# Patient Record
Sex: Female | Born: 1965
Health system: Southern US, Community
[De-identification: ages and names within clinical notes are randomized; demographics above are authoritative.]

## PROBLEM LIST (undated history)

## (undated) DIAGNOSIS — K9041 Non-celiac gluten sensitivity: Secondary | ICD-10-CM

## (undated) DIAGNOSIS — I10 Essential (primary) hypertension: Secondary | ICD-10-CM

## (undated) DIAGNOSIS — R112 Nausea with vomiting, unspecified: Secondary | ICD-10-CM

## (undated) DIAGNOSIS — H903 Sensorineural hearing loss, bilateral: Secondary | ICD-10-CM

## (undated) DIAGNOSIS — M1990 Unspecified osteoarthritis, unspecified site: Secondary | ICD-10-CM

## (undated) DIAGNOSIS — K580 Irritable bowel syndrome with diarrhea: Secondary | ICD-10-CM

## (undated) DIAGNOSIS — Z862 Personal history of diseases of the blood and blood-forming organs and certain disorders involving the immune mechanism: Secondary | ICD-10-CM

## (undated) DIAGNOSIS — K900 Celiac disease: Secondary | ICD-10-CM

## (undated) DIAGNOSIS — N882 Stricture and stenosis of cervix uteri: Secondary | ICD-10-CM

## (undated) DIAGNOSIS — Z9889 Other specified postprocedural states: Secondary | ICD-10-CM

## (undated) DIAGNOSIS — Z5181 Encounter for therapeutic drug level monitoring: Secondary | ICD-10-CM

## (undated) DIAGNOSIS — Z860101 Personal history of adenomatous and serrated colon polyps: Secondary | ICD-10-CM

## (undated) DIAGNOSIS — M069 Rheumatoid arthritis, unspecified: Secondary | ICD-10-CM

## (undated) DIAGNOSIS — Z974 Presence of external hearing-aid: Secondary | ICD-10-CM

## (undated) DIAGNOSIS — N939 Abnormal uterine and vaginal bleeding, unspecified: Secondary | ICD-10-CM

## (undated) DIAGNOSIS — Z86010 Personal history of colonic polyps: Secondary | ICD-10-CM

## (undated) HISTORY — PX: TUBAL LIGATION: SHX77

## (undated) HISTORY — PX: TOTAL KNEE ARTHROPLASTY: SHX125

## (undated) HISTORY — PX: BREAST LUMPECTOMY: SHX2

## (undated) HISTORY — PX: KNEE ARTHROSCOPY: SUR90

---

## 1998-02-26 HISTORY — PX: TUBAL LIGATION: SHX77

## 2004-10-31 ENCOUNTER — Other Ambulatory Visit: Admission: RE | Admit: 2004-10-31 | Discharge: 2004-10-31 | Payer: Self-pay | Admitting: Obstetrics and Gynecology

## 2005-11-22 ENCOUNTER — Encounter: Admission: RE | Admit: 2005-11-22 | Discharge: 2005-11-22 | Payer: Self-pay | Admitting: *Deleted

## 2005-11-22 ENCOUNTER — Other Ambulatory Visit: Admission: RE | Admit: 2005-11-22 | Discharge: 2005-11-22 | Payer: Self-pay | Admitting: Obstetrics and Gynecology

## 2007-02-27 HISTORY — PX: ENDOMETRIAL ABLATION: SHX621

## 2007-02-27 HISTORY — PX: DILATION AND CURETTAGE OF UTERUS: SHX78

## 2007-03-25 ENCOUNTER — Encounter: Admission: RE | Admit: 2007-03-25 | Discharge: 2007-03-25 | Payer: Self-pay | Admitting: Obstetrics and Gynecology

## 2007-04-03 ENCOUNTER — Other Ambulatory Visit: Admission: RE | Admit: 2007-04-03 | Discharge: 2007-04-03 | Payer: Self-pay | Admitting: Obstetrics and Gynecology

## 2007-04-30 ENCOUNTER — Encounter (INDEPENDENT_AMBULATORY_CARE_PROVIDER_SITE_OTHER): Payer: Self-pay | Admitting: Obstetrics and Gynecology

## 2007-04-30 ENCOUNTER — Ambulatory Visit (HOSPITAL_COMMUNITY): Admission: RE | Admit: 2007-04-30 | Discharge: 2007-04-30 | Payer: Self-pay | Admitting: Obstetrics and Gynecology

## 2007-04-30 HISTORY — PX: HYSTEROSCOPY W/ ENDOMETRIAL ABLATION: SUR665

## 2008-03-25 ENCOUNTER — Encounter: Admission: RE | Admit: 2008-03-25 | Discharge: 2008-03-25 | Payer: Self-pay | Admitting: Obstetrics and Gynecology

## 2008-05-11 ENCOUNTER — Other Ambulatory Visit: Admission: RE | Admit: 2008-05-11 | Discharge: 2008-05-11 | Payer: Self-pay | Admitting: Obstetrics and Gynecology

## 2009-03-28 ENCOUNTER — Encounter: Admission: RE | Admit: 2009-03-28 | Discharge: 2009-03-28 | Payer: Self-pay | Admitting: Obstetrics and Gynecology

## 2009-05-12 ENCOUNTER — Other Ambulatory Visit: Admission: RE | Admit: 2009-05-12 | Discharge: 2009-05-12 | Payer: Self-pay | Admitting: Obstetrics and Gynecology

## 2009-07-29 ENCOUNTER — Encounter: Admission: RE | Admit: 2009-07-29 | Discharge: 2009-07-29 | Payer: Self-pay | Admitting: *Deleted

## 2009-08-02 ENCOUNTER — Encounter: Admission: RE | Admit: 2009-08-02 | Discharge: 2009-08-02 | Payer: Self-pay | Admitting: *Deleted

## 2010-02-26 HISTORY — PX: KNEE ARTHROSCOPY W/ ACL RECONSTRUCTION: SHX1858

## 2010-05-04 ENCOUNTER — Other Ambulatory Visit: Payer: Self-pay | Admitting: Obstetrics and Gynecology

## 2010-05-04 DIAGNOSIS — N644 Mastodynia: Secondary | ICD-10-CM

## 2010-05-10 ENCOUNTER — Ambulatory Visit
Admission: RE | Admit: 2010-05-10 | Discharge: 2010-05-10 | Disposition: A | Discharge status: Home / Self Care | Payer: BC Managed Care – PPO | Source: Ambulatory Visit | Attending: Obstetrics and Gynecology | Admitting: Obstetrics and Gynecology

## 2010-05-10 DIAGNOSIS — N644 Mastodynia: Secondary | ICD-10-CM

## 2010-07-11 NOTE — Op Note (Signed)
NAME:  Wendy Waller, Wendy Waller NO.:  [id number]   MEDICAL RECORD NO.:  [id number]          PATIENT TYPE:  AMB   LOCATION:  SDC                           FACILITY:  [facility]   PHYSICIAN:  Gerald Leitz, MD          DATE OF BIRTH:  05-28-1965   DATE OF PROCEDURE:  04/30/2007  DATE OF DISCHARGE:                               OPERATIVE REPORT   PREOPERATIVE DIAGNOSES:  1. Menorrhagia.  2. Irregular menstruation.   POSTOPERATIVE DIAGNOSES:  1. Menorrhagia.  2. Irregular menstruation.   PROCEDURE:  Hysteroscopy, dilation and curettage and Novasure  endometrial ablation.   SURGEON:  Gerald Leitz, M.D.   ASSISTANT:  None.   ANESTHESIA:  General.   FINDINGS:  Normal appearing endometrial cavity.   SPECIMENS:  Endometrial curettings.   DISPOSITION OF SPECIMEN:  Sent to pathology.   ESTIMATED BLOOD LOSS:  Minimal.   LR DEFICIT:  45 mL.   COMPLICATIONS:  None.   PROCEDURE:  Informed consent was obtained.  The patient was taken to the  operating room where she was placed under general anesthesia.  She was  placed in dorsal lithotomy position and then prepped and draped in the  usual sterile fashion.  Bivalve speculum placed into the vaginal vault.  The anterior lip of the cervix was grasped with a single-tooth  tenaculum.  The uterus sounded to 8 cm cervical canal length was  approximately 3 cm, cavity length of 5.0 cm.  Hysteroscope was inserted  with the findings noted above.  It was removed and the cervix was  dilated up to 8 mm.  Sharp curettage was performed until gritty texture  was noted all way around.  Hysteroscope was then reinserted.  No  evidence of perforation was seen.  Hysteroscope was removed.  The  Novasure apparatus was set at a cavity length of 5.0 cm.  It was  inserted into the cervix and endometrial cavity.  Once the fundus of the  uterus was encountered, the array was deployed.  The instrument was  seated.  The cavity width was noted to be 3.1 cm.  Cavity  assessment  test was performed and this was passed.  The Novasure endometrial  ablation was performed at a power of 85 for a total of one minute and 54  seconds.  The array was allowed to cool.  It was then removed from the  endometrial cavity.  Hysteroscope was reinserted.  There was no evidence  of perforation and the endometrial cavity  appeared adequately ablated.  The single-tooth tenaculum was removed  from anterior lip of the cervix.  Excellent hemostasis was noted.  Bivalve speculum was removed.  The patient was awaken from anesthesia,  taken to the recovery room awake and in stable condition.  Sponge, lap,  needle counts were correct x2.      Gerald Leitz, MD  Electronically Signed     TC/MEDQ  D:  04/30/2007  T:  05/01/2007  Job:  [id number]

## 2010-07-14 NOTE — H&P (Signed)
NAME:  Wendy Waller, Wendy Waller NO.:  [id number]   MEDICAL RECORD NO.:  [id number]          PATIENT TYPE:  AMB   LOCATION:                                [facility]  [facility]   PHYSICIAN:  Gerald Leitz, MD               DATE OF BIRTH:   DATE OF ADMISSION:  04/30/2007  DATE OF DISCHARGE:                              HISTORY & PHYSICAL   HISTORY OF PRESENT ILLNESS:  This is a 45 year old G3, P2-0-1-2 with  menorrhagia and irregular menses with failed multiple forms of hormonal  therapy including NuvaRing, Mirena IUD, who desires treatment with  NovaSure ablation.  She had an endometrial biopsy that was negative on  April 11, 2007.   PAST OBSTETRICAL HISTORY:  1. Spontaneous vaginal delivery x2.  2. Miscarriage x1.   GYNECOLOGIC HISTORY:  1. Negative for sexually transmitted diseases.  2. Last Pap smear was April 03, 2007, which was negative.   PAST MEDICAL HISTORY:  Hypertension.   PAST SURGICAL HISTORY:  1. Oral surgery.  2. Bilateral tubal ligation.  3. Lipoma removed from the  right groin.   MEDICATIONS:  Hydrochlorothiazide, potassium, Claritin as needed, and  iron sulfate.   ALLERGIES:  No known drug allergies.   SOCIAL HISTORY:  The patient is married.  Occasional alcohol use.  No  tobacco or illicit drug use.   FAMILY HISTORY:  Mother with proactive bilateral mastectomies due to  precancerous masses of the breasts.  No history of ovarian or colon  cancer.   REVIEW OF SYSTEMS:  Negative except as stated in history of present  illness.   PHYSICAL EXAMINATION:  VITAL SIGNS:  Blood pressure 128/90, weight 126  pounds.  CARDIOVASCULAR:  Regular rate and rhythm.  LUNGS:  Clear to auscultation bilaterally.  ABDOMEN:  Soft, nontender, nondistended.  No masses.  PELVIC:  Normal external female genitalia.  No vulvar, vaginal, cervical  lesions.   Ultrasound performed April 21, 2007.  It shows the uterus measured 8-  cm in length, AP diameter 4.15-cm, width  of 5.17-cm, endometrial  thickness 0.39 -cm.   ASSESSMENT/PLAN:  A 45 year old with menorrhagia and irregular menses,  desires therapy with hysteroscopy, dilatation and curettage, and  endometrial ablation.  Risks, benefits, and alternatives were discussed  with the patient, including but not limited to, infection, bleeding,  possible uterine perforation with a need for further  surgery.  The  patient voiced understanding and desires to proceed with hysteroscopy  and NovaSure ablation.      Gerald Leitz, MD  Electronically Signed     TC/MEDQ  D:  04/21/2007  T:  04/21/2007  Job:  [id number]

## 2010-08-23 ENCOUNTER — Ambulatory Visit (HOSPITAL_BASED_OUTPATIENT_CLINIC_OR_DEPARTMENT_OTHER)
Admission: RE | Admit: 2010-08-23 | Discharge: 2010-08-23 | Disposition: A | Discharge status: Home / Self Care | Payer: BC Managed Care – PPO | Source: Ambulatory Visit | Attending: Orthopedic Surgery | Admitting: Orthopedic Surgery

## 2010-08-23 DIAGNOSIS — Z01812 Encounter for preprocedural laboratory examination: Secondary | ICD-10-CM | POA: Insufficient documentation

## 2010-08-23 DIAGNOSIS — X500XXA Overexertion from strenuous movement or load, initial encounter: Secondary | ICD-10-CM | POA: Insufficient documentation

## 2010-08-23 DIAGNOSIS — S83509A Sprain of unspecified cruciate ligament of unspecified knee, initial encounter: Secondary | ICD-10-CM | POA: Insufficient documentation

## 2010-08-23 DIAGNOSIS — IMO0002 Reserved for concepts with insufficient information to code with codable children: Secondary | ICD-10-CM | POA: Insufficient documentation

## 2010-08-23 HISTORY — PX: KNEE ARTHROSCOPY W/ ACL RECONSTRUCTION: SHX1858

## 2010-11-20 LAB — BASIC METABOLIC PANEL
BUN: 13
CO2: 23
Calcium: 9.4
Creatinine, Ser: 0.58
GFR calc non Af Amer: >60

## 2010-11-20 LAB — URINALYSIS, ROUTINE W REFLEX MICROSCOPIC
Bilirubin Urine: NEGATIVE
Glucose, UA: NEGATIVE
Hgb urine dipstick: NEGATIVE
Ketones, ur: NEGATIVE
Protein, ur: NEGATIVE
pH: 5

## 2010-11-20 LAB — DIFFERENTIAL
Basophils Absolute: 0
Basophils Relative: 0
Lymphocytes Relative: 18
Neutro Abs: 4.7

## 2010-11-20 LAB — CBC
Platelets: 216
RDW: 14.2

## 2011-04-09 ENCOUNTER — Other Ambulatory Visit: Payer: Self-pay | Admitting: Dermatology

## 2011-04-16 ENCOUNTER — Other Ambulatory Visit: Payer: Self-pay | Admitting: Obstetrics and Gynecology

## 2011-04-16 DIAGNOSIS — Z1231 Encounter for screening mammogram for malignant neoplasm of breast: Secondary | ICD-10-CM

## 2011-05-11 ENCOUNTER — Ambulatory Visit
Admission: RE | Admit: 2011-05-11 | Discharge: 2011-05-11 | Disposition: A | Discharge status: Home / Self Care | Payer: BC Managed Care – PPO | Source: Ambulatory Visit | Attending: Obstetrics and Gynecology | Admitting: Obstetrics and Gynecology

## 2011-05-11 DIAGNOSIS — Z1231 Encounter for screening mammogram for malignant neoplasm of breast: Secondary | ICD-10-CM

## 2011-10-23 ENCOUNTER — Other Ambulatory Visit (HOSPITAL_COMMUNITY)
Admission: RE | Admit: 2011-10-23 | Discharge: 2011-10-23 | Disposition: A | Discharge status: Home / Self Care | Payer: BC Managed Care – PPO | Source: Ambulatory Visit | Attending: Obstetrics and Gynecology | Admitting: Obstetrics and Gynecology

## 2011-10-23 ENCOUNTER — Other Ambulatory Visit: Payer: Self-pay | Admitting: Nurse Practitioner

## 2011-10-23 DIAGNOSIS — Z01419 Encounter for gynecological examination (general) (routine) without abnormal findings: Secondary | ICD-10-CM | POA: Insufficient documentation

## 2012-04-03 ENCOUNTER — Other Ambulatory Visit: Payer: Self-pay | Admitting: Obstetrics and Gynecology

## 2012-04-03 DIAGNOSIS — Z1231 Encounter for screening mammogram for malignant neoplasm of breast: Secondary | ICD-10-CM

## 2012-05-12 ENCOUNTER — Ambulatory Visit: Payer: BC Managed Care – PPO

## 2012-05-12 ENCOUNTER — Ambulatory Visit
Admission: RE | Admit: 2012-05-12 | Discharge: 2012-05-12 | Disposition: A | Discharge status: Home / Self Care | Payer: BC Managed Care – PPO | Source: Ambulatory Visit | Attending: Obstetrics and Gynecology | Admitting: Obstetrics and Gynecology

## 2013-02-26 HISTORY — PX: BREAST EXCISIONAL BIOPSY: SUR124

## 2013-02-26 HISTORY — PX: BREAST BIOPSY: SHX20

## 2013-04-29 ENCOUNTER — Other Ambulatory Visit: Payer: Self-pay

## 2013-04-29 DIAGNOSIS — Z1231 Encounter for screening mammogram for malignant neoplasm of breast: Secondary | ICD-10-CM

## 2013-05-14 ENCOUNTER — Ambulatory Visit
Admission: RE | Admit: 2013-05-14 | Discharge: 2013-05-14 | Disposition: A | Discharge status: Home / Self Care | Payer: BC Managed Care – PPO | Source: Ambulatory Visit

## 2013-05-14 DIAGNOSIS — Z1231 Encounter for screening mammogram for malignant neoplasm of breast: Secondary | ICD-10-CM

## 2013-05-19 ENCOUNTER — Other Ambulatory Visit: Payer: Self-pay | Admitting: Obstetrics and Gynecology

## 2013-05-19 DIAGNOSIS — R928 Other abnormal and inconclusive findings on diagnostic imaging of breast: Secondary | ICD-10-CM

## 2013-05-21 ENCOUNTER — Ambulatory Visit
Admission: RE | Admit: 2013-05-21 | Discharge: 2013-05-21 | Disposition: A | Discharge status: Home / Self Care | Payer: BC Managed Care – PPO | Source: Ambulatory Visit | Attending: Obstetrics and Gynecology | Admitting: Obstetrics and Gynecology

## 2013-05-21 ENCOUNTER — Other Ambulatory Visit: Payer: Self-pay | Admitting: Obstetrics and Gynecology

## 2013-05-21 DIAGNOSIS — R928 Other abnormal and inconclusive findings on diagnostic imaging of breast: Secondary | ICD-10-CM

## 2013-06-08 ENCOUNTER — Other Ambulatory Visit (INDEPENDENT_AMBULATORY_CARE_PROVIDER_SITE_OTHER): Payer: Self-pay | Admitting: General Surgery

## 2013-06-08 ENCOUNTER — Encounter (INDEPENDENT_AMBULATORY_CARE_PROVIDER_SITE_OTHER): Payer: Self-pay | Admitting: General Surgery

## 2013-06-08 ENCOUNTER — Ambulatory Visit (INDEPENDENT_AMBULATORY_CARE_PROVIDER_SITE_OTHER): Payer: BC Managed Care – PPO | Admitting: General Surgery

## 2013-06-08 VITALS — BP 124/74 | HR 74 | Temp 97.6°F | Resp 14 | Ht 61.0 in | Wt 123.6 lb

## 2013-06-08 DIAGNOSIS — N6092 Unspecified benign mammary dysplasia of left breast: Secondary | ICD-10-CM

## 2013-06-08 DIAGNOSIS — N62 Hypertrophy of breast: Secondary | ICD-10-CM

## 2013-06-08 NOTE — Progress Notes (Addendum)
Patient ID: Wendy Waller, female   DOB: 1965-12-30, 48 y.o.   MRN: [id number]  Note: This dictation was prepared with Dragon/digital dictation along with [facility] technology. Any transcriptional errors that result from this process are unintentional.  Presents with:    Atypical hyperplasia left breast    HPI Wendy Waller is a 48 y.o. female.  She is referred by Dr. Melanee Spry at the [facility]  for evaluation and surgical management of atypical lobular hyperplasia of the left breast, upper outer quadrant.  In the past, the patient has been told she has dense breasts. She had a image guided biopsy of the left breast in the past and was told it was completely benign. She gets annual mammograms. Recent mammograms showed a 12 mm area of clustered calcifications in the posterior aspect of the upper outer quadrant left breast. There is no mass effect. There are other scattered calcifications. Image guided biopsy shows atypical lobular hyperplasia.  FH -  her mother had multiple biopsies, presumably for high risk findings, and also had a subcutaneous mastectomy. There is no family history of breast cancer. There is no family history of ovarian cancer  Comorbidities include celiac disease. She has some type of mixed connective tissue disorder and takes Voltaren and plaquenil and is followed by Dr. Gavin Pound. She is otherwise pretty healthy and active and plays tennis.  Her husband is with her today. The denies tobacco use  HPI  History reviewed. No pertinent past medical history.  Past Surgical History  Procedure Laterality Date  . Knee arthroscopy w/ acl reconstruction  2012  . Tubal ligation      Family History  Problem Relation Age of Onset  . Cancer Mother   . Cancer Paternal Grandmother     Throat and Lung    Social History History  Substance Use Topics  . Smoking status: Never Smoker   . Smokeless tobacco: Not on file  . Alcohol Use: Yes    No Known Allergies  Current Outpatient  Prescriptions  Medication Sig Dispense Refill  . diclofenac (VOLTAREN) 75 MG EC tablet       . diphenhydrAMINE (SOMINEX) 25 MG tablet Take 25 mg by mouth at bedtime as needed for sleep.      . hydroxychloroquine (PLAQUENIL) 200 MG tablet Take 200 mg by mouth 2 (two) times daily.       No current facility-administered medications for this visit.    Review of Systems Review of Systems  Constitutional: Negative for unexpected weight change.  HENT: Negative for hearing loss, trouble swallowing and voice change.   Eyes: Negative for visual disturbance.  Respiratory: Negative for wheezing.   Cardiovascular: Negative for palpitations and leg swelling.  Gastrointestinal: Negative for diarrhea, constipation, blood in stool, abdominal distention and anal bleeding.  Genitourinary: Negative for hematuria, vaginal bleeding and difficulty urinating.  Skin: Negative for wound.  Neurological: Negative for seizures and syncope.  Hematological: Negative for adenopathy. Does not bruise/bleed easily.  Psychiatric/Behavioral: Negative for confusion.    Blood pressure 124/74, pulse 74, temperature 97.6 F (36.4 C), resp. rate 14, height 5\' 1"  (1.549 m), weight 123 lb 9.6 oz (56.065 kg).  Physical Exam Physical Exam  Constitutional: She is oriented to person, place, and time. She appears well-developed and well-nourished. No distress.  HENT:  Head: Normocephalic and atraumatic.  Nose: Nose normal.  Mouth/Throat: No oropharyngeal exudate.  Eyes: Conjunctivae and EOM are normal. Pupils are equal, round, and reactive to light. Left eye exhibits no discharge.  No scleral icterus.  Neck: Neck supple. No JVD present. No tracheal deviation present. No thyromegaly present.  Cardiovascular: Normal rate, regular rhythm, normal heart sounds and intact distal pulses.   No murmur heard. Pulmonary/Chest: Effort normal and breath sounds normal. No respiratory distress. She has no wheezes. She has no rales. She  exhibits no tenderness.  Bra size 34C per patient. Small bruise upper outer quadrant left breast. No masses in either breast. No other skin changes. No axillary adenopathy.  Abdominal: Soft. Bowel sounds are normal. She exhibits no distension and no mass. There is no tenderness. There is no rebound and no guarding.  Musculoskeletal: She exhibits no edema and no tenderness.  Lymphadenopathy:    She has no cervical adenopathy.  Neurological: She is alert and oriented to person, place, and time. She exhibits normal muscle tone. Coordination normal.  Skin: Skin is warm. No rash noted. She is not diaphoretic. No erythema. No pallor.  Psychiatric: She has a normal mood and affect. Her behavior is normal. Judgment and thought content normal.    Data Reviewed Mammograms. Histology report.  Assessment    Atypical lobular hyperplasia left breast, upper outer quadrant, 12  millimeter focus mammographically. Conservative excision is indicated to exclude early breast cancer.  Celiac disease  Mixed connective tissue disorder     Plan    After a long conversation with the patient and her husband, she will be scheduled for left lumpectomy with radioactive seed localization.  I discussed the indications, details, techniques, and numerous risk of the surgery with her and her husband. She is aware of the risk of bleeding, infection, cosmetic deformity, reoperation for positive margins of cancer, and other unforeseen problems. She understands all these issues and all of her questions were answered and she agrees with this plan.  We discussed possible referral to high risk breast clinic postop.        Edsel Petrin. Dalbert Batman, M.D., [facility] General and Minimally invasive Surgery Breast and Colorectal Surgery Office:   [phone] Pager:   [phone]  06/08/2013, 3:53 PM

## 2013-06-08 NOTE — Patient Instructions (Signed)
Your mammograms showed a small cluster of calcifications in the upper outer quadrant of the left breast, although there is no mass effect.  Image guided biopsy shows atypical lobular hyperplasia. This is associated with somewhat increased risk for breast cancer.  We advised that you have this area conservatively excised for complete histological evaluation.  you will be scheduled for left lumpectomy with radioactive seed localization in the near future.     Lumpectomy, Care After Refer to this sheet in the next few weeks. These instructions provide you with information on caring for yourself after your procedure. Your health care provider may also give you more specific instructions. Your treatment has been planned according to current medical practices, but problems sometimes occur. Call your health care provider if you have any problems or questions after your procedure. WHAT TO EXPECT AFTER THE PROCEDURE After your procedure, it is typical to have soreness, bruising, and swelling of your breast. This is normal. You will be given medicines to control your pain. HOME CARE INSTRUCTIONS  Only take over-the-counter or prescription medicines for pain, discomfort, or fever as directed by your health care provider.  Resume a normal diet as directed by your health care provider.  Resume normal activity as directed by your health care provider. Avoid strenuous activity that affects the arm on the side that the surgical cut (incision) was made. Avoid playing tennis, swimming, lifting heavy objects (those that weigh more than 10 pounds [4.5 kg]), and pulling for 2 weeks.  Change bandages (dressings) as directed by your health care provider.  Consider wearing a bra to bed if you feel discomfort at the breast. Wearing a bra also helps keep dressings on.  Keep all follow-up appointments with your health care provider.  Call for the results of your procedure as instructed by your surgeon. It is your  responsibility to get the results of your lumpectomy if your surgeon asked you to follow up. Do not assume everything is fine if you have not heard from your health care provider.  Keep the incision site dry.  If the incision site is tender, applying an ice pack may relieve some discomfort. To do this:  Put ice in a plastic bag.  Place a towel between your skin and the bag.  Leave the ice on for 15 20 minutes, 3 4 times a day. SEEK MEDICAL CARE IF:   You have increased bleeding from the wound.  You notice redness, swelling, or increasing pain in the incision.  You have pus coming from the wound.  You have a fever.  You notice a foul smell coming from the incision or dressing. SEEK IMMEDIATE MEDICAL CARE IF:   You develop a rash.  You have shortness of breath.  You have chest pain. Document Released: 02/28/2006 Document Revised: 12/03/2012 Document Reviewed: 09/11/2012 [facility] Patient Information 2014 [place], [place].

## 2013-06-26 DIAGNOSIS — N6092 Unspecified benign mammary dysplasia of left breast: Secondary | ICD-10-CM

## 2013-06-26 HISTORY — DX: Unspecified benign mammary dysplasia of left breast: N60.92

## 2013-07-06 ENCOUNTER — Ambulatory Visit
Admission: RE | Admit: 2013-07-06 | Discharge: 2013-07-06 | Disposition: A | Discharge status: Home / Self Care | Payer: BC Managed Care – PPO | Source: Ambulatory Visit | Attending: General Surgery | Admitting: General Surgery

## 2013-07-06 ENCOUNTER — Encounter (HOSPITAL_BASED_OUTPATIENT_CLINIC_OR_DEPARTMENT_OTHER): Payer: Self-pay | Admitting: *Deleted

## 2013-07-06 DIAGNOSIS — N6092 Unspecified benign mammary dysplasia of left breast: Secondary | ICD-10-CM

## 2013-07-06 NOTE — H&P (Signed)
Wendy Waller   MRN:  [id number]   Description: 48 year old female  Provider: Adin Hector, MD  Department: [facility]            Diagnoses      Atypical lobular hyperplasia of left breast    -  Primary      611.1           Current Vitals Most recent update: 06/08/2013  2:57 PM by Vale Haven, CMA      BP Pulse Temp(Src) Resp Ht Wt      124/74 74 97.6 F (36.4 C) 14 5\' 1"  (1.549 m) 123 lb 9.6 oz (56.065 kg)      BMI 23.37 kg/m2                      History and Physical   Adin Hector, MD       Status: Signed            Patient ID: Wendy Waller, female   DOB: Jun 18, 1965, 48 y.o.   MRN: [id number]              Note:  This dictation was prepared with Dragon/digital dictation along with [facility] technology. Any transcriptional errors that result from this process are unintentional.   HPI Wendy Waller is a 48 y.o. female.  She is referred by Dr. Melanee Spry at the [facility]  for evaluation and surgical management of atypical lobular hyperplasia of the left breast, upper outer quadrant.   In the past, the patient has been told she has dense breasts. She had a image guided biopsy of the left breast in the past and was told it was completely benign. She gets annual mammograms. Recent mammograms showed a 12 mm area of clustered calcifications in the posterior aspect of the upper outer quadrant left breast. There is no mass effect. There are other scattered calcifications. Image guided biopsy shows atypical lobular hyperplasia.   FH -  her mother had multiple biopsies, presumably for high risk findings, and also had a subcutaneous mastectomy. There is no family history of breast cancer. There is no family history of ovarian cancer   Comorbidities include celiac disease. She has some type of mixed connective tissue disorder and takes Voltaren and plaquenil and is followed by Dr. Gavin Pound. She is otherwise pretty healthy and active and plays tennis.   Her husband  is with her today. The denies tobacco use  Benign Prostatic Hypertrophy Pertinent negatives include no abdominal pain, arthralgias, chest pain, chills, congestion, coughing, fever, headaches, nausea, rash, sore throat or vomiting.           Past Surgical History   Procedure  Laterality  Date   .  Knee arthroscopy w/ acl reconstruction    2012   .  Tubal ligation             Family History   Problem  Relation  Age of Onset   .  Cancer  Mother     .  Cancer  Paternal Grandmother         Throat and Lung        Social History History   Substance Use Topics   .  Smoking status:  Never Smoker    .  Smokeless tobacco:  Not on file   .  Alcohol Use:  Yes        No Known Allergies    Current Outpatient  Prescriptions   Medication  Sig  Dispense  Refill   .  diclofenac (VOLTAREN) 75 MG EC tablet           .  diphenhydrAMINE (SOMINEX) 25 MG tablet  Take 25 mg by mouth at bedtime as needed for sleep.         .  hydroxychloroquine (PLAQUENIL) 200 MG tablet  Take 200 mg by mouth 2 (two) times daily.             .        ROS:  Constitutional: Negative for fever, chills and unexpected weight change.  HENT: Negative for congestion, hearing loss, sore throat, trouble swallowing and voice change.   Eyes: Negative for visual disturbance.  Respiratory: Negative for cough and wheezing.   Cardiovascular: Negative for chest pain, palpitations and leg swelling.  Gastrointestinal: Negative for nausea, vomiting, abdominal pain, diarrhea, constipation, blood in stool, abdominal distention and anal bleeding.  Genitourinary: Negative for hematuria, vaginal bleeding and difficulty urinating.  Musculoskeletal: Negative for arthralgias.  Skin: Negative for rash and wound.  Neurological: Negative for seizures, syncope and headaches.  Hematological: Negative for adenopathy. Does not bruise/bleed easily.  Psychiatric/Behavioral: Negative for confusion.      Blood pressure 124/74,  pulse 74, temperature 97.6 F (36.4 C), resp. rate 14, height 5\' 1"  (1.549 m), weight 123 lb 9.6 oz (56.065 kg).   Physical Exam   Constitutional: She is oriented to person, place, and time. She appears well-developed and well-nourished. No distress.  HENT:   Head: Normocephalic and atraumatic.   Nose: Nose normal.   Mouth/Throat: No oropharyngeal exudate.  Eyes: Conjunctivae and EOM are normal. Pupils are equal, round, and reactive to light. Left eye exhibits no discharge. No scleral icterus.  Neck: Neck supple. No JVD present. No tracheal deviation present. No thyromegaly present.  Cardiovascular: Normal rate, regular rhythm, normal heart sounds and intact distal pulses.    No murmur heard. Pulmonary/Chest: Effort normal and breath sounds normal. No respiratory distress. She has no wheezes. She has no rales. She exhibits no tenderness.  Bra size 34C per patient. Small bruise upper outer quadrant left breast. No masses in either breast. No other skin changes. No axillary adenopathy.  Abdominal: Soft. Bowel sounds are normal. She exhibits no distension and no mass. There is no tenderness. There is no rebound and no guarding.  Musculoskeletal: She exhibits no edema and no tenderness.  Lymphadenopathy:    She has no cervical adenopathy.  Neurological: She is alert and oriented to person, place, and time. She exhibits normal muscle tone. Coordination normal.  Skin: Skin is warm. No rash noted. She is not diaphoretic. No erythema. No pallor.  Psychiatric: She has a normal mood and affect. Her behavior is normal. Judgment and thought content normal.      Data Reviewed Mammograms. Histology report.   Assessment    Atypical lobular hyperplasia left breast, upper outer quadrant, 12  millimeter focus mammographically. Conservative excision is indicated to exclude early breast cancer.   Celiac disease   Mixed connective tissue disorder      Plan    After a long conversation with  the patient and her husband, she will be scheduled for left lumpectomy with radioactive seed localization.   I discussed the indications, details, techniques, and numerous risk of the surgery with her and her husband. She is aware of the risk of bleeding, infection, cosmetic deformity, reoperation for positive margins of cancer, and other unforeseen problems. She understands all  these issues and all of her questions were answered and she agrees with this plan.   We discussed possible referral to high risk breast clinic postop.           Edsel Petrin. Dalbert Batman, M.D., [facility] General and Minimally invasive Surgery Breast and Colorectal Surgery Office:   [phone] Pager:   [phone]

## 2013-07-06 NOTE — Progress Notes (Signed)
No labs

## 2013-07-07 ENCOUNTER — Ambulatory Visit (HOSPITAL_BASED_OUTPATIENT_CLINIC_OR_DEPARTMENT_OTHER)
Admission: RE | Admit: 2013-07-07 | Discharge: 2013-07-07 | Disposition: A | Discharge status: Home / Self Care | Payer: BC Managed Care – PPO | Source: Ambulatory Visit | Attending: General Surgery | Admitting: General Surgery

## 2013-07-07 ENCOUNTER — Encounter (HOSPITAL_BASED_OUTPATIENT_CLINIC_OR_DEPARTMENT_OTHER): Payer: BC Managed Care – PPO | Admitting: Anesthesiology

## 2013-07-07 ENCOUNTER — Encounter (HOSPITAL_BASED_OUTPATIENT_CLINIC_OR_DEPARTMENT_OTHER)
Admission: RE | Disposition: A | Discharge status: Home / Self Care | Payer: Self-pay | Source: Ambulatory Visit | Attending: General Surgery

## 2013-07-07 ENCOUNTER — Ambulatory Visit
Admission: RE | Admit: 2013-07-07 | Discharge: 2013-07-07 | Disposition: A | Discharge status: Home / Self Care | Payer: BC Managed Care – PPO | Source: Ambulatory Visit | Attending: General Surgery | Admitting: General Surgery

## 2013-07-07 ENCOUNTER — Ambulatory Visit (HOSPITAL_BASED_OUTPATIENT_CLINIC_OR_DEPARTMENT_OTHER): Payer: BC Managed Care – PPO | Admitting: Anesthesiology

## 2013-07-07 ENCOUNTER — Encounter (HOSPITAL_BASED_OUTPATIENT_CLINIC_OR_DEPARTMENT_OTHER): Payer: Self-pay | Admitting: Anesthesiology

## 2013-07-07 DIAGNOSIS — D0590 Unspecified type of carcinoma in situ of unspecified breast: Secondary | ICD-10-CM | POA: Insufficient documentation

## 2013-07-07 DIAGNOSIS — N6092 Unspecified benign mammary dysplasia of left breast: Secondary | ICD-10-CM

## 2013-07-07 DIAGNOSIS — M3589 Other specified systemic involvement of connective tissue: Secondary | ICD-10-CM | POA: Insufficient documentation

## 2013-07-07 DIAGNOSIS — M358 Other specified systemic involvement of connective tissue: Secondary | ICD-10-CM | POA: Insufficient documentation

## 2013-07-07 DIAGNOSIS — D4860 Neoplasm of uncertain behavior of unspecified breast: Secondary | ICD-10-CM

## 2013-07-07 DIAGNOSIS — N6019 Diffuse cystic mastopathy of unspecified breast: Secondary | ICD-10-CM | POA: Insufficient documentation

## 2013-07-07 DIAGNOSIS — K900 Celiac disease: Secondary | ICD-10-CM | POA: Insufficient documentation

## 2013-07-07 DIAGNOSIS — D249 Benign neoplasm of unspecified breast: Secondary | ICD-10-CM

## 2013-07-07 DIAGNOSIS — Z79899 Other long term (current) drug therapy: Secondary | ICD-10-CM | POA: Insufficient documentation

## 2013-07-07 DIAGNOSIS — N6089 Other benign mammary dysplasias of unspecified breast: Secondary | ICD-10-CM | POA: Insufficient documentation

## 2013-07-07 HISTORY — DX: Celiac disease: K90.0

## 2013-07-07 HISTORY — DX: Nausea with vomiting, unspecified: R11.2

## 2013-07-07 HISTORY — DX: Non-celiac gluten sensitivity: K90.41

## 2013-07-07 HISTORY — DX: Nausea with vomiting, unspecified: Z98.890

## 2013-07-07 HISTORY — PX: BREAST LUMPECTOMY WITH RADIOACTIVE SEED LOCALIZATION: SHX6424

## 2013-07-07 SURGERY — BREAST LUMPECTOMY WITH RADIOACTIVE SEED LOCALIZATION
Anesthesia: General | Site: Breast | Laterality: Left

## 2013-07-07 MED ORDER — MIDAZOLAM HCL 2 MG/2ML IJ SOLN
INTRAMUSCULAR | Status: AC
  Filled 2013-07-07: qty 2

## 2013-07-07 MED ORDER — MIDAZOLAM HCL 5 MG/5ML IJ SOLN
INTRAMUSCULAR | Status: DC | PRN
  Administered 2013-07-07: 2 mg via INTRAVENOUS

## 2013-07-07 MED ORDER — LIDOCAINE HCL (CARDIAC) 20 MG/ML IV SOLN
INTRAVENOUS | Status: DC | PRN
  Administered 2013-07-07: 40 mg via INTRAVENOUS

## 2013-07-07 MED ORDER — CEFAZOLIN SODIUM-DEXTROSE 2-3 GM-% IV SOLR
2.0000 g | INTRAVENOUS | Status: AC
  Administered 2013-07-07: 2 g via INTRAVENOUS

## 2013-07-07 MED ORDER — DEXAMETHASONE SODIUM PHOSPHATE 4 MG/ML IJ SOLN
INTRAMUSCULAR | Status: DC | PRN
  Administered 2013-07-07: 10 mg via INTRAVENOUS

## 2013-07-07 MED ORDER — BUPIVACAINE-EPINEPHRINE (PF) 0.5% -1:200000 IJ SOLN
INTRAMUSCULAR | Status: DC | PRN
  Administered 2013-07-07: 4.5 mL

## 2013-07-07 MED ORDER — HYDROCODONE-ACETAMINOPHEN 5-325 MG PO TABS: 1.0000 | ORAL_TABLET | Freq: Four times a day (QID) | ORAL | Status: DC | PRN

## 2013-07-07 MED ORDER — CEFAZOLIN SODIUM-DEXTROSE 2-3 GM-% IV SOLR
INTRAVENOUS | Status: AC
  Filled 2013-07-07: qty 50

## 2013-07-07 MED ORDER — FENTANYL CITRATE 0.05 MG/ML IJ SOLN
INTRAMUSCULAR | Status: DC | PRN
  Administered 2013-07-07 (×4): 50 ug via INTRAVENOUS

## 2013-07-07 MED ORDER — CHLORHEXIDINE GLUCONATE 4 % EX LIQD: 1.0000 "application " | Freq: Once | CUTANEOUS | Status: DC

## 2013-07-07 MED ORDER — HYDROMORPHONE HCL PF 1 MG/ML IJ SOLN
INTRAMUSCULAR | Status: AC
  Filled 2013-07-07: qty 1

## 2013-07-07 MED ORDER — BUPIVACAINE-EPINEPHRINE (PF) 0.5% -1:200000 IJ SOLN
INTRAMUSCULAR | Status: AC
  Filled 2013-07-07: qty 30

## 2013-07-07 MED ORDER — LACTATED RINGERS IV SOLN
INTRAVENOUS | Status: DC
  Administered 2013-07-07: 08:00:00 via INTRAVENOUS
  Administered 2013-07-07: 10 mL/h via INTRAVENOUS

## 2013-07-07 MED ORDER — OXYCODONE HCL 5 MG PO TABS
5.0000 mg | ORAL_TABLET | Freq: Once | ORAL | Status: AC | PRN
  Administered 2013-07-07: 5 mg via ORAL

## 2013-07-07 MED ORDER — MIDAZOLAM HCL 2 MG/2ML IJ SOLN: 1.0000 mg | INTRAMUSCULAR | Status: DC | PRN

## 2013-07-07 MED ORDER — ONDANSETRON HCL 4 MG/2ML IJ SOLN: 4.0000 mg | Freq: Once | INTRAMUSCULAR | Status: DC | PRN

## 2013-07-07 MED ORDER — FENTANYL CITRATE 0.05 MG/ML IJ SOLN: 50.0000 ug | INTRAMUSCULAR | Status: DC | PRN

## 2013-07-07 MED ORDER — PROPOFOL 10 MG/ML IV BOLUS
INTRAVENOUS | Status: DC | PRN
  Administered 2013-07-07: 200 mg via INTRAVENOUS

## 2013-07-07 MED ORDER — OXYCODONE HCL 5 MG/5ML PO SOLN: 5.0000 mg | Freq: Once | ORAL | Status: AC | PRN

## 2013-07-07 MED ORDER — ONDANSETRON HCL 4 MG/2ML IJ SOLN
INTRAMUSCULAR | Status: DC | PRN
  Administered 2013-07-07: 4 mg via INTRAVENOUS

## 2013-07-07 MED ORDER — OXYCODONE HCL 5 MG PO TABS
ORAL_TABLET | ORAL | Status: AC
  Filled 2013-07-07: qty 1

## 2013-07-07 MED ORDER — FENTANYL CITRATE 0.05 MG/ML IJ SOLN
INTRAMUSCULAR | Status: AC
  Filled 2013-07-07: qty 6

## 2013-07-07 MED ORDER — HYDROMORPHONE HCL PF 1 MG/ML IJ SOLN
0.2500 mg | INTRAMUSCULAR | Status: DC | PRN
  Administered 2013-07-07 (×2): 0.5 mg via INTRAVENOUS

## 2013-07-07 SURGICAL SUPPLY — 68 items
ADH SKN CLS APL DERMABOND .7 (GAUZE/BANDAGES/DRESSINGS) ×1
APL SKNCLS STERI-STRIP NONHPOA (GAUZE/BANDAGES/DRESSINGS)
APPLIER CLIP 9.375 MED OPEN (MISCELLANEOUS)
APR CLP MED 9.3 20 MLT OPN (MISCELLANEOUS)
BENZOIN TINCTURE PRP APPL 2/3 (GAUZE/BANDAGES/DRESSINGS) IMPLANT
BINDER BREAST LRG (GAUZE/BANDAGES/DRESSINGS) ×2 IMPLANT
BINDER BREAST MEDIUM (GAUZE/BANDAGES/DRESSINGS) IMPLANT
BINDER BREAST XLRG (GAUZE/BANDAGES/DRESSINGS) IMPLANT
BINDER BREAST XXLRG (GAUZE/BANDAGES/DRESSINGS) IMPLANT
BLADE 10 SAFETY STRL DISP (BLADE) IMPLANT
BLADE 15 SAFETY STRL DISP (BLADE) ×1 IMPLANT
BLADE HEX COATED 2.75 (ELECTRODE) ×3 IMPLANT
BLADE SURG 15 STRL LF DISP TIS (BLADE) IMPLANT
BLADE SURG 15 STRL SS (BLADE) ×3
CANISTER SUC SOCK COL 7IN (MISCELLANEOUS) ×1 IMPLANT
CANISTER SUCT 1200ML W/VALVE (MISCELLANEOUS) ×3 IMPLANT
CHLORAPREP W/TINT 26ML (MISCELLANEOUS) ×3 IMPLANT
CLIP APPLIE 9.375 MED OPEN (MISCELLANEOUS) IMPLANT
CLOSURE WOUND 1/2 X4 (GAUZE/BANDAGES/DRESSINGS)
COVER MAYO STAND STRL (DRAPES) ×3 IMPLANT
COVER PROBE W GEL 5X96 (DRAPES) ×3 IMPLANT
COVER TABLE BACK 60X90 (DRAPES) ×3 IMPLANT
DECANTER SPIKE VIAL GLASS SM (MISCELLANEOUS) IMPLANT
DERMABOND ADVANCED (GAUZE/BANDAGES/DRESSINGS) ×2
DERMABOND ADVANCED .7 DNX12 (GAUZE/BANDAGES/DRESSINGS) ×1 IMPLANT
DEVICE DUBIN W/COMP PLATE 8390 (MISCELLANEOUS) ×3 IMPLANT
DRAIN CHANNEL 19F RND (DRAIN) IMPLANT
DRAPE LAPAROSCOPIC ABDOMINAL (DRAPES) ×3 IMPLANT
DRAPE UTILITY XL STRL (DRAPES) ×3 IMPLANT
DRSG PAD ABDOMINAL 8X10 ST (GAUZE/BANDAGES/DRESSINGS) IMPLANT
ELECT REM PT RETURN 9FT ADLT (ELECTROSURGICAL) ×3
ELECTRODE REM PT RTRN 9FT ADLT (ELECTROSURGICAL) ×1 IMPLANT
EVACUATOR SILICONE 100CC (DRAIN) IMPLANT
GLOVE BIOGEL PI IND STRL 6.5 (GLOVE) IMPLANT
GLOVE BIOGEL PI INDICATOR 6.5 (GLOVE) ×2
GLOVE ECLIPSE 6.5 STRL STRAW (GLOVE) ×2 IMPLANT
GLOVE EUDERMIC 7 POWDERFREE (GLOVE) ×3 IMPLANT
GOWN STRL REUS W/ TWL LRG LVL3 (GOWN DISPOSABLE) ×1 IMPLANT
GOWN STRL REUS W/ TWL XL LVL3 (GOWN DISPOSABLE) ×1 IMPLANT
GOWN STRL REUS W/TWL LRG LVL3 (GOWN DISPOSABLE) ×3
GOWN STRL REUS W/TWL XL LVL3 (GOWN DISPOSABLE) ×3
KIT MARKER MARGIN INK (KITS) IMPLANT
NDL HYPO 25X1 1.5 SAFETY (NEEDLE) IMPLANT
NEEDLE HYPO 25X1 1.5 SAFETY (NEEDLE) ×3 IMPLANT
NS IRRIG 1000ML POUR BTL (IV SOLUTION) ×3 IMPLANT
PACK BASIN DAY SURGERY FS (CUSTOM PROCEDURE TRAY) ×3 IMPLANT
PENCIL BUTTON HOLSTER BLD 10FT (ELECTRODE) ×3 IMPLANT
PIN SAFETY STERILE (MISCELLANEOUS) ×1 IMPLANT
SHEET MEDIUM DRAPE 40X70 STRL (DRAPES) IMPLANT
SLEEVE SCD COMPRESS KNEE MED (MISCELLANEOUS) ×3 IMPLANT
SPONGE GAUZE 4X4 12PLY STER LF (GAUZE/BANDAGES/DRESSINGS) IMPLANT
SPONGE LAP 18X18 X RAY DECT (DISPOSABLE) IMPLANT
SPONGE LAP 4X18 X RAY DECT (DISPOSABLE) ×3 IMPLANT
STRIP CLOSURE SKIN 1/2X4 (GAUZE/BANDAGES/DRESSINGS) IMPLANT
SUT ETHILON 3 0 FSL (SUTURE) IMPLANT
SUT MNCRL AB 4-0 PS2 18 (SUTURE) ×5 IMPLANT
SUT SILK 2 0 SH (SUTURE) ×3 IMPLANT
SUT VIC AB 2-0 CT1 27 (SUTURE)
SUT VIC AB 2-0 CT1 TAPERPNT 27 (SUTURE) IMPLANT
SUT VIC AB 3-0 SH 27 (SUTURE)
SUT VIC AB 3-0 SH 27X BRD (SUTURE) IMPLANT
SUT VICRYL 3-0 CR8 SH (SUTURE) ×3 IMPLANT
SYR CONTROL 10ML LL (SYRINGE) ×2 IMPLANT
TOWEL OR 17X24 6PK STRL BLUE (TOWEL DISPOSABLE) ×3 IMPLANT
TOWEL OR NON WOVEN STRL DISP B (DISPOSABLE) ×3 IMPLANT
TUBE CONNECTING 20'X1/4 (TUBING) ×1
TUBE CONNECTING 20X1/4 (TUBING) ×2 IMPLANT
YANKAUER SUCT BULB TIP NO VENT (SUCTIONS) ×3 IMPLANT

## 2013-07-07 NOTE — Anesthesia Preprocedure Evaluation (Signed)
Anesthesia Evaluation  Patient identified by MRN, date of birth, ID band Patient awake    Reviewed: Allergy & Precautions, H&P , NPO status , Patient's Chart, lab work & pertinent test results  History of Anesthesia Complications Negative for: history of anesthetic complications  Airway Mallampati: I TM Distance: >3 FB Neck ROM: Full    Dental  (+) Teeth Intact, Dental Advisory Given   Pulmonary  breath sounds clear to auscultation        Cardiovascular Rhythm:Regular Rate:Normal     Neuro/Psych    GI/Hepatic   Endo/Other    Renal/GU      Musculoskeletal   Abdominal   Peds  Hematology   Anesthesia Other Findings   Reproductive/Obstetrics                           Anesthesia Physical Anesthesia Plan  ASA: II  Anesthesia Plan: General   Post-op Pain Management:    Induction: Intravenous  Airway Management Planned: LMA  Additional Equipment:   Intra-op Plan:   Post-operative Plan: Extubation in OR  Informed Consent: I have reviewed the patients History and Physical, chart, labs and discussed the procedure including the risks, benefits and alternatives for the proposed anesthesia with the patient or authorized representative who has indicated his/her understanding and acceptance.   Dental advisory given  Plan Discussed with: CRNA, Anesthesiologist and Surgeon  Anesthesia Plan Comments:         Anesthesia Quick Evaluation

## 2013-07-07 NOTE — Transfer of Care (Signed)
Immediate Anesthesia Transfer of Care Note  Patient: Wendy Waller  Procedure(s) Performed: Procedure(s): BREAST LUMPECTOMY WITH RADIOACTIVE SEED LOCALIZATION (Left)  Patient Location: PACU  Anesthesia Type:General  Level of Consciousness: awake, alert , oriented and patient cooperative  Airway & Oxygen Therapy: Patient Spontanous Breathing and Patient connected to face mask oxygen  Post-op Assessment: Report given to PACU RN and Post -op Vital signs reviewed and stable  Post vital signs: Reviewed and stable  Complications: No apparent anesthesia complications

## 2013-07-07 NOTE — Discharge Instructions (Signed)
[facility] °Office Phone Number [phone] ° °BREAST BIOPSY/ PARTIAL MASTECTOMY: POST OP INSTRUCTIONS ° °Always review your discharge instruction sheet given to you by the facility where your surgery was performed. ° °IF YOU HAVE DISABILITY OR FAMILY LEAVE FORMS, YOU MUST BRING THEM TO THE OFFICE FOR PROCESSING.  DO NOT GIVE THEM TO YOUR DOCTOR. ° °1. A prescription for pain medication may be given to you upon discharge.  Take your pain medication as prescribed, if needed.  If narcotic pain medicine is not needed, then you may take acetaminophen (Tylenol) or ibuprofen (Advil) as needed. °2. Take your usually prescribed medications unless otherwise directed °3. If you need a refill on your pain medication, please contact your pharmacy.  They will contact our office to request authorization.  Prescriptions will not be filled after 5pm or on week-ends. °4. You should eat very light the first 24 hours after surgery, such as soup, crackers, pudding, etc.  Resume your normal diet the day after surgery. °5. Most patients will experience some swelling and bruising in the breast.  Ice packs and a good support bra will help.  Swelling and bruising can take several days to resolve.  °6. It is common to experience some constipation if taking pain medication after surgery.  Increasing fluid intake and taking a stool softener will usually help or prevent this problem from occurring.  A mild laxative (Milk of Magnesia or Miralax) should be taken according to package directions if there are no bowel movements after 48 hours. °7. Unless discharge instructions indicate otherwise, you may remove your bandages 24-48 hours after surgery, and you may shower at that time.  You may have steri-strips (small skin tapes) in place directly over the incision.  These strips should be left on the skin for 7-10 days.  If your surgeon used skin glue on the incision, you may shower in 24 hours.  The glue will flake off over the  next 2-3 weeks.  Any sutures or staples will be removed at the office during your follow-up visit. °8. ACTIVITIES:  You may resume regular daily activities (gradually increasing) beginning the next day.  Wearing a good support bra or sports bra minimizes pain and swelling.  You may have sexual intercourse when it is comfortable. °a. You may drive when you no longer are taking prescription pain medication, you can comfortably wear a seatbelt, and you can safely maneuver your car and apply brakes. °b. RETURN TO WORK:  ______________________________________________________________________________________ °9. You should see your doctor in the office for a follow-up appointment approximately two weeks after your surgery.  Your doctor’s nurse will typically make your follow-up appointment when she calls you with your pathology report.  Expect your pathology report 2-3 business days after your surgery.  You may call to check if you do not hear from us after three days. °10. OTHER INSTRUCTIONS: _______________________________________________________________________________________________ _____________________________________________________________________________________________________________________________________ °_____________________________________________________________________________________________________________________________________ °_____________________________________________________________________________________________________________________________________ ° °WHEN TO CALL YOUR DOCTOR: °1. Fever over 101.0 °2. Nausea and/or vomiting. °3. Extreme swelling or bruising. °4. Continued bleeding from incision. °5. Increased pain, redness, or drainage from the incision. ° °The clinic staff is available to answer your questions during regular business hours.  Please don’t hesitate to call and ask to speak to one of the nurses for clinical concerns.  If you have a medical emergency, go to the nearest  emergency room or call 911.  A surgeon from [facility] is always on call at the hospital. ° °For further questions, please visit [email or web address]  ° ° °  Post Anesthesia Home Care Instructions ° °Activity: °Get plenty of rest for the remainder of the day. A responsible adult should stay with you for 24 hours following the procedure.  °For the next 24 hours, DO NOT: °-Drive a car °-Operate machinery °-Drink alcoholic beverages °-Take any medication unless instructed by your physician °-Make any legal decisions or sign important papers. ° °Meals: °Start with liquid foods such as gelatin or soup. Progress to regular foods as tolerated. Avoid greasy, spicy, heavy foods. If nausea and/or vomiting occur, drink only clear liquids until the nausea and/or vomiting subsides. Call your physician if vomiting continues. ° °Special Instructions/Symptoms: °Your throat may feel dry or sore from the anesthesia or the breathing tube placed in your throat during surgery. If this causes discomfort, gargle with warm salt water. The discomfort should disappear within 24 hours. ° °

## 2013-07-07 NOTE — Interval H&P Note (Signed)
History and Physical Interval Note:  07/07/2013 8:27 AM  Wendy Waller  has presented today for surgery, with the diagnosis of atypical hyperplasia left breast   The various methods of treatment have been discussed with the patient and family. After consideration of risks, benefits and other options for treatment, the patient has consented to  Procedure(s): BREAST LUMPECTOMY WITH RADIOACTIVE SEED LOCALIZATION (Left) as a surgical intervention .  The patient's history has been reviewed, patient examined today, no change in status, stable for surgery.  I have reviewed the patient's chart and labs.  Questions were answered to the patient's satisfaction.     Adin Hector

## 2013-07-07 NOTE — Anesthesia Postprocedure Evaluation (Signed)
  Anesthesia Post-op Note  Patient: Wendy Waller  Procedure(s) Performed: Procedure(s): BREAST LUMPECTOMY WITH RADIOACTIVE SEED LOCALIZATION (Left)  Patient Location: PACU  Anesthesia Type:General  Level of Consciousness: awake, alert  and oriented  Airway and Oxygen Therapy: Patient Spontanous Breathing  Post-op Pain: mild  Post-op Assessment: Post-op Vital signs reviewed  Post-op Vital Signs: Reviewed  Last Vitals:  Filed Vitals:   07/07/13 1030  BP: 128/82  Pulse: 70  Temp:   Resp: 13    Complications: No apparent anesthesia complications

## 2013-07-07 NOTE — Anesthesia Procedure Notes (Signed)
Procedure Name: LMA Insertion Date/Time: 07/07/2013 9:07 AM Performed by: Toula Moos L Pre-anesthesia Checklist: Patient identified, Emergency Drugs available, Suction available, Patient being monitored and Timeout performed Patient Re-evaluated:Patient Re-evaluated prior to inductionOxygen Delivery Method: Circle System Utilized Preoxygenation: Pre-oxygenation with 100% oxygen Intubation Type: IV induction Ventilation: Mask ventilation without difficulty LMA: LMA inserted LMA Size: 3.0 Number of attempts: 1 Airway Equipment and Method: bite block Placement Confirmation: positive ETCO2 and breath sounds checked- equal and bilateral Tube secured with: Tape Dental Injury: Teeth and Oropharynx as per pre-operative assessment

## 2013-07-07 NOTE — Op Note (Signed)
Patient Name:           Wendy Waller   Date of Surgery:        07/07/2013   Note: This dictation was prepared with Dragon/digital dictation along with [facility] technology. Any transcriptional errors that result from this process are unintentional.   Pre op Diagnosis:      Atypical lobular hyperplasia left breast, upper outer quadrant posterior third  Post op Diagnosis:    Same  Procedure:                 Left partial mastectomy with radioactive seed localization and margin assessment  Surgeon:                     Edsel Petrin. Dalbert Batman, M.D., FACS  Assistant:                      None  Operative Indications:   Wendy Waller is a 48 y.o. female. She is referred by Dr. Melanee Spry at the [facility] for evaluation and surgical management of atypical lobular hyperplasia of the left breast, upper outer quadrant.  In the past, the patient has been told she has dense breasts. She had a image guided biopsy of the left breast in the past and was told it was completely benign. She gets annual mammograms. Recent mammograms showed a 12 mm area of clustered calcifications in the posterior aspect of the upper outer quadrant left breast. There is no mass effect. There are other scattered calcifications. Image guided biopsy shows atypical lobular hyperplasia.  Comorbidities include celiac disease. She has some type of mixed connective tissue disorder and takes Voltaren and plaquenil and is followed by Dr. Gavin Pound.  She is brought to the operating room electively   Operative Findings:       The biopsy marker clip and radioactive seed were identified radiographically and with the neoprobe prior to making any incision. The specimen was removed from the lateral aspect of the left breast but this was very deep and the posterior margin was the pectoralis fascia. The specimen mammogram looked good with the biopsy marker clip and the radioactive seed in the specimen. There was a single radioactive seed.  Procedure in Detail:           The radioactive seed was placed yesterday. The patient was brought to the operating room at [facility]. General anesthesia was induced. The left breast was prepped and draped in a sterile fashion. Intravenous antibiotics were given. Surgical time out was performed. 0.5% Marcaine with epinephrine was used as local infiltration anesthetic. Using the neoprobe I identified the area of maximal radioactivity in the left breast. This was about 3 cm lateral to the left areolar margin at about the 3:30 position. A curvilinear incision was made at this point, paralleling the areolar margin. Dissection was carried down into the breast tissue and then using the neoprobe I dissected widely around the radioactivity. The specimen was removed and marked with silk sutures in  3 cardinal positions to orient the pathologist. With the neoprobe I could hear the radioactivity in the specimen and there was no residual radioactivity of the breast. Specimen mammogram looked good she  described above. Specimen was marked and sent to pathology. Hemostasis was excellent and achieved with electrocautery. The wound was irrigated with saline. The breast tissues were closed in 2 separate layers with interrupted sutures of 3-0 Vicryl and skin closed with a running subcuticular  suture of 4-0 Monocryl and Dermabond. Breast binder was placed. Patient taken to recovery in stable condition. EBL 10 cc. Counts correct. Complications none.     Edsel Petrin. Dalbert Batman, M.D., FACS General and Minimally Invasive Surgery Breast and Colorectal Surgery  07/07/2013 10:10 AM

## 2013-07-08 ENCOUNTER — Telehealth (INDEPENDENT_AMBULATORY_CARE_PROVIDER_SITE_OTHER): Payer: Self-pay | Admitting: General Surgery

## 2013-07-08 DIAGNOSIS — Z9889 Other specified postprocedural states: Secondary | ICD-10-CM

## 2013-07-08 MED ORDER — ONDANSETRON 4 MG PO TBDP: 4.0000 mg | ORAL_TABLET | Freq: Four times a day (QID) | ORAL | Status: DC | PRN

## 2013-07-08 NOTE — Telephone Encounter (Signed)
Patient called in needing a 3 week po appt from her breast lumpectomy on 07/07/13.  Informed her that there were no open spots at the moment and that I would call her back once I speak with Dr. Dalbert Batman on where to put her on his scheduled.  She also explained that she has been having N/V since she got home yesterday.  She has been trying crackers and ginger ale with no relief.  Informed her that per protocol I would call in Zofran 4mg  1 tab Q6H PRN nausea, #15 w/ no refills.

## 2013-07-08 NOTE — Progress Notes (Signed)
Quick Note:  Inform patient of Pathology report,.Tell her that there is no evidence of cancer. Just lobular neoplasia. Obviously excellent news. I will discuss this with her it in detail at the next office visit.  hmi ______

## 2013-07-08 NOTE — Telephone Encounter (Signed)
Spoke with pt and informed her i scheduled her on 6/3 at 8:15 w/ Dr. Dalbert Batman.

## 2013-07-09 ENCOUNTER — Telehealth (INDEPENDENT_AMBULATORY_CARE_PROVIDER_SITE_OTHER): Payer: Self-pay

## 2013-07-09 ENCOUNTER — Encounter (HOSPITAL_BASED_OUTPATIENT_CLINIC_OR_DEPARTMENT_OTHER): Payer: Self-pay | Admitting: General Surgery

## 2013-07-09 NOTE — Telephone Encounter (Signed)
Per Dr Darrel Hoover request pt notified of path result. Pt will keep 6-3 ov.

## 2013-07-22 ENCOUNTER — Telehealth (INDEPENDENT_AMBULATORY_CARE_PROVIDER_SITE_OTHER): Payer: Self-pay | Admitting: General Surgery

## 2013-07-22 NOTE — Telephone Encounter (Signed)
She may play tennis with sports bra 3 weeks postop

## 2013-07-22 NOTE — Telephone Encounter (Signed)
Called pt with Dr. Darrel Hoover response to her questions.   She understands and will comply with waiting until 3 weeks postop.

## 2013-07-22 NOTE — Telephone Encounter (Signed)
Pt called to ask if she can resume upper body exercise now. She is two weeks postop from seed lumpectomy.  Pt reports she has limited her exercising so far and is getting along well since surgery.  Pt also asked when can she resume playing tennis.  Her post op appt is on 07/29/13.  Please advise.

## 2013-07-29 ENCOUNTER — Encounter (INDEPENDENT_AMBULATORY_CARE_PROVIDER_SITE_OTHER): Payer: Self-pay | Admitting: General Surgery

## 2013-07-29 ENCOUNTER — Ambulatory Visit (INDEPENDENT_AMBULATORY_CARE_PROVIDER_SITE_OTHER): Payer: BC Managed Care – PPO | Admitting: General Surgery

## 2013-07-29 VITALS — BP 114/74 | HR 64 | Temp 98.2°F | Resp 14 | Ht 61.0 in | Wt 119.4 lb

## 2013-07-29 DIAGNOSIS — N6092 Unspecified benign mammary dysplasia of left breast: Secondary | ICD-10-CM

## 2013-07-29 DIAGNOSIS — N62 Hypertrophy of breast: Secondary | ICD-10-CM

## 2013-07-29 NOTE — Progress Notes (Signed)
Patient ID: RAMANI RIVA, female   DOB: 05/02/1965, 48 y.o.   MRN: [id number] History: This patient underwent left lumpectomy with radioactive seed localization on 07/07/2013 because of an image guided biopsy showing atypical lobular hyperplasia. Preoperative imaging showed very dense breasts. They contemplated biopsying another area but apparently that was felt to be low risk.The final pathology shows lobular neoplasia, lobular carcinoma in situ, fibrocystic changes, and pseudoangiomatous stromal hyperplasia. She has no specific complaints about wound healing other than soreness.  Exam: Patient looks well. No distress The incision laterally is healing uneventfully.Excellent cosmesis  Assessment: Lobular carcinoma in situ and fibrocystic disease and PASH. Left breast, 3:30 position. Recovering uneventfully following left lumpectomy  Plan: I spent a good deal of time discussing her pathology with her and its implications. She was given a copy of the pathology report. She was advised that this is not a cancer that requires treatment but is a histologic change associated with somewhat increased risk. Advised close surveillance with annual breast examination mammography We talked about referral to medical oncology for high risk clinic assessment. She is interested in that. She is concerned about having other abnormal areas in her breast because they're so dense on mammography and the radial jaundice were contemplating another biopsy. I think this would be an excellent situation for screening MRI. We talked about that. She understands that there is excellent sensitivity but only good specificity and sometimes we get false positive. She is accepting of that. Therefore we are going to schedule her for MRI of the breast, assuming we can get this preauthorized Mammography in one year At her request, she will return to see me after her annual mammography.Marland Kitchen  Marland KitchenEdsel Petrin. Dalbert Batman, M.D., [facility] General and Minimally invasive Surgery Breast and Colorectal Surgery Office:   [phone] Pager:   [phone]

## 2013-07-29 NOTE — Patient Instructions (Addendum)
You are recovering from your left breast lumpectomy without any obvious surgical complications.  The final pathology shows lobular neoplasia, also known as lobular carcinoma in situ. This does not require any further surgery or specific treatment.This is not a true cancer but does somewhat increases your risk for breast cancer in the future.  It is important that you get annual physician exam and annual mammograms forever.  you will be referred to the high-risk breast clinic to discuss whether there are any other strategies to reduce your risk.  Because she had very dense breast and you have lobular carcinoma in situ, he will be referred for MRI of the breast as a screening tool, assuming that we can get this preauthorized.  We discussed close clinical followup, and she requested followup with me in one year after her annual mammography. We are happy to do that.

## 2013-07-31 ENCOUNTER — Telehealth: Payer: Self-pay | Admitting: *Deleted

## 2013-07-31 NOTE — Telephone Encounter (Signed)
Called pt and confirmed 08/18/13 appt w/ pt.  Emailed Musician at [facility] to make her aware.

## 2013-08-05 ENCOUNTER — Ambulatory Visit
Admission: RE | Admit: 2013-08-05 | Discharge: 2013-08-05 | Disposition: A | Discharge status: Home / Self Care | Payer: BC Managed Care – PPO | Source: Ambulatory Visit | Attending: General Surgery | Admitting: General Surgery

## 2013-08-05 DIAGNOSIS — N6092 Unspecified benign mammary dysplasia of left breast: Secondary | ICD-10-CM

## 2013-08-05 MED ORDER — GADOBENATE DIMEGLUMINE 529 MG/ML IV SOLN
10.0000 mL | Freq: Once | INTRAVENOUS | Status: AC | PRN
  Administered 2013-08-05: 10 mL via INTRAVENOUS

## 2013-08-06 ENCOUNTER — Other Ambulatory Visit: Payer: Self-pay | Admitting: *Deleted

## 2013-08-06 DIAGNOSIS — N62 Hypertrophy of breast: Secondary | ICD-10-CM

## 2013-08-10 ENCOUNTER — Other Ambulatory Visit (INDEPENDENT_AMBULATORY_CARE_PROVIDER_SITE_OTHER): Payer: Self-pay | Admitting: General Surgery

## 2013-08-10 ENCOUNTER — Telehealth (INDEPENDENT_AMBULATORY_CARE_PROVIDER_SITE_OTHER): Payer: Self-pay | Admitting: General Surgery

## 2013-08-10 DIAGNOSIS — R928 Other abnormal and inconclusive findings on diagnostic imaging of breast: Secondary | ICD-10-CM

## 2013-08-10 NOTE — Telephone Encounter (Signed)
Called up at the [facility] and left an message to [facility] who does the MRI to see if the patient is schedule to do an U/S of the right breast. I told Juliann Pulse to call me back to let me know something

## 2013-08-10 NOTE — Telephone Encounter (Signed)
Juliann Pulse from the [facility] and stated that she will schedule the patient for an right breast U/S on this Wednesday or Thursday of this week, she also stated that she will put in orders for Dr Dalbert Batman to sign off on them

## 2013-08-11 NOTE — Telephone Encounter (Signed)
Noted. Will watch for result. Dr Dalbert Batman is back and should see order to sign.

## 2013-08-17 ENCOUNTER — Ambulatory Visit
Admission: RE | Admit: 2013-08-17 | Discharge: 2013-08-17 | Disposition: A | Discharge status: Home / Self Care | Payer: BC Managed Care – PPO | Source: Ambulatory Visit | Attending: General Surgery | Admitting: General Surgery

## 2013-08-17 ENCOUNTER — Other Ambulatory Visit (INDEPENDENT_AMBULATORY_CARE_PROVIDER_SITE_OTHER): Payer: Self-pay | Admitting: General Surgery

## 2013-08-17 ENCOUNTER — Other Ambulatory Visit (HOSPITAL_COMMUNITY): Payer: Self-pay | Admitting: Diagnostic Radiology

## 2013-08-17 DIAGNOSIS — R928 Other abnormal and inconclusive findings on diagnostic imaging of breast: Secondary | ICD-10-CM

## 2013-08-18 ENCOUNTER — Other Ambulatory Visit (HOSPITAL_BASED_OUTPATIENT_CLINIC_OR_DEPARTMENT_OTHER): Payer: BC Managed Care – PPO

## 2013-08-18 ENCOUNTER — Ambulatory Visit (HOSPITAL_BASED_OUTPATIENT_CLINIC_OR_DEPARTMENT_OTHER): Payer: BC Managed Care – PPO | Admitting: Hematology and Oncology

## 2013-08-18 ENCOUNTER — Other Ambulatory Visit: Payer: BC Managed Care – PPO

## 2013-08-18 ENCOUNTER — Telehealth: Payer: Self-pay | Admitting: Hematology and Oncology

## 2013-08-18 ENCOUNTER — Encounter: Payer: Self-pay | Admitting: Hematology and Oncology

## 2013-08-18 ENCOUNTER — Other Ambulatory Visit: Payer: Self-pay | Admitting: Hematology and Oncology

## 2013-08-18 VITALS — BP 113/76 | HR 71 | Temp 98.4°F | Resp 20 | Ht 61.0 in | Wt 121.2 lb

## 2013-08-18 DIAGNOSIS — Z1231 Encounter for screening mammogram for malignant neoplasm of breast: Secondary | ICD-10-CM

## 2013-08-18 DIAGNOSIS — D0590 Unspecified type of carcinoma in situ of unspecified breast: Secondary | ICD-10-CM

## 2013-08-18 DIAGNOSIS — N6092 Unspecified benign mammary dysplasia of left breast: Secondary | ICD-10-CM

## 2013-08-18 DIAGNOSIS — N62 Hypertrophy of breast: Secondary | ICD-10-CM

## 2013-08-18 LAB — COMPREHENSIVE METABOLIC PANEL (CC13)
ALBUMIN: 4.2 g/dL (ref 3.5–5.0)
ALT: 15 U/L (ref 0–55)
AST: 17 U/L (ref 5–34)
Alkaline Phosphatase: 67 U/L (ref 40–150)
Anion Gap: 8 mEq/L (ref 3–11)
BUN: 17.1 mg/dL (ref 7.0–26.0)
CALCIUM: 9.5 mg/dL (ref 8.4–10.4)
CHLORIDE: 104 meq/L (ref 98–109)
CO2: 27 mEq/L (ref 22–29)
Creatinine: 0.8 mg/dL (ref 0.6–1.1)
Glucose: 118 mg/dl (ref 70–140)
Potassium: 4.2 mEq/L (ref 3.5–5.1)
Sodium: 138 mEq/L (ref 136–145)
Total Bilirubin: 0.44 mg/dL (ref 0.20–1.20)
Total Protein: 7.6 g/dL (ref 6.4–8.3)

## 2013-08-18 LAB — CBC WITH DIFFERENTIAL/PLATELET
BASO%: 0.6 % (ref 0.0–2.0)
BASOS ABS: 0 10*3/uL (ref 0.0–0.1)
EOS%: 1.4 % (ref 0.0–7.0)
Eosinophils Absolute: 0.1 10*3/uL (ref 0.0–0.5)
HCT: 36.2 % (ref 34.8–46.6)
HEMOGLOBIN: 12.2 g/dL (ref 11.6–15.9)
LYMPH#: 0.8 10*3/uL — AB (ref 0.9–3.3)
LYMPH%: 15.2 % (ref 14.0–49.7)
MCH: 32.5 pg (ref 25.1–34.0)
MCHC: 33.7 g/dL (ref 31.5–36.0)
MCV: 96.5 fL (ref 79.5–101.0)
MONO#: 0.3 10*3/uL (ref 0.1–0.9)
MONO%: 6 % (ref 0.0–14.0)
NEUT#: 4.2 10*3/uL (ref 1.5–6.5)
NEUT%: 76.8 % (ref 38.4–76.8)
Platelets: 170 10*3/uL (ref 145–400)
RBC: 3.75 10*6/uL (ref 3.70–5.45)
RDW: 12.4 % (ref 11.2–14.5)
WBC: 5.5 10*3/uL (ref 3.9–10.3)

## 2013-08-18 NOTE — Progress Notes (Signed)
[facility] [phone]   [phone]  CONSULT NOTE  REFERRING PHYSICIAN: Gaynelle Arabian, MD Surgery: Dr. Fanny Skates  Rheumatology: Dr. Ed Blalock  REASON FOR CONSULTATION:  Further evaluation and management of newly diagnosed Lobular carcinoma in situ of the left breast  HPI Wendy Waller is a 48 y.o. female. Who was found to have indeterminate calcifications within the posterior upper outer left breast on routine screening mammogram later on she had digital diagnostic mammogram on 05/21/2013 and that revealed 12 mm group of indeterminate calcifications within the posterior upper-outer left breast .she underwent a left stereotactic core needle biopsy and that revealed atypical  lobular hyperplasia. She was evaluated by Dr. Dalbert Batman  and underwent left breast lumpectomy on 07/07/2013 and that revealed lobular  carcinoma in situ, fibrocystic changes and pseudoangiomatous stromal hyperplasia.  Her MRI of the breast performed on 08/05/2013 that revealed an indeterminate enhancing nodule in the upper inner quadrant of the right breast. She underwent ultrasound-guided biopsy of the right breast yesterday and that revealed Benign breast tissue negative for atypia or malignancy.  She does not smoke any cigarettes and she drinks alcohol 1-3 times in a week. She has 2 children and she breast-fed  both her children. Her age at first pregnancy was below age 82. She attained menarche at the age of 74 years. Her mother had partial mastectomy and is alive .she is she did not receive any treatment. She does not know whether her mother had cancer or not.  Past Medical History  Diagnosis Date  . Celiac disease   . Gluten intolerance   . PONV (postoperative nausea and vomiting)     only with gluten crackers    Past Surgical History  Procedure Laterality Date  . Knee arthroscopy w/ acl reconstruction  2012    left  . Tubal ligation    . Knee arthroscopy   [id number]    left x2  . Dilation and curettage of uterus  2009  . Endometrial ablation  2009  . Breast lumpectomy with radioactive seed localization Left 07/07/2013    Procedure: BREAST LUMPECTOMY WITH RADIOACTIVE SEED LOCALIZATION;  Surgeon: Adin Hector, MD;  Location: [facility];  Service: General;  Laterality: Left;    Family History  Problem Relation Age of Onset  . Cancer Mother   . Cancer Paternal Grandmother     Throat and Lung    Social History History  Substance Use Topics  . Smoking status: Never Smoker   . Smokeless tobacco: Not on file  . Alcohol Use: Yes    No Known Allergies  Current Outpatient Prescriptions  Medication Sig Dispense Refill  . cetirizine (ZYRTEC) 10 MG tablet Take 10 mg by mouth daily.      . diclofenac (VOLTAREN) 75 MG EC tablet       . diphenhydrAMINE (SOMINEX) 25 MG tablet Take 25 mg by mouth at bedtime as needed for sleep.      . hydroxychloroquine (PLAQUENIL) 200 MG tablet Take 200 mg by mouth 2 (two) times daily.       No current facility-administered medications for this visit.    Review of Systems A detailed 14 review of systems is been assessed. She denies any fever, shortness of breath, chest pain, palpitations, cough, constipation, diarrhea, blood in the stool or blood in the urine. she gets abdominal pain and diarrhea intermittently because of celeic disease. She complains of muscle pains and joint pains. She was evaluated by the  rheumatology and was told to have mixed connective tissue disorder and she is on the plaquenol and she says is helping her a lot. She also takes diclofenac  as needed for pains. She denies any weight loss and decrease in appetite. she does good exercise 5 times a week and follows healthy diet   Physical Exam  GENERAL:alert and oriented x3,  no distress, well nourished and well developed SKIN: no rashes or significant lesions HEAD: Normocephalic, atraumatic EYES: PERRLA, EOMI,  Conjunctiva are pink and non-injected, sclera clear EARS: External ears normal OROPHARYNX:no erythema, lips, buccal mucosa, and tongue normal and mucous membranes are moist  NECK: supple, no adenopathy, no JVD, no stridor, non-tender LYMPH:  no palpable lymphadenopathy, no hepatosplenomegaly BREAST exam: left breast lumpectomy scar noted in good healing stage. Right breast status post biopsy scar in healing stage.no skin or nipple changes or axillary nodes LUNGS: clear to auscultation , coarse sounds heard HEART: regular rate & rhythm ABDOMEN: soft, obese and normal bowel sounds BACK: symmetric, no curvature. EXTREMITIES:no edema, no clubbing and no cyanosis  NEURO: alert & oriented x 3 with fluent speech, no focal motor/sensory deficits, gait normal   PERFORMANCE STATUS: ECoG performance status 0  LABORATORY DATA: Lab Results  Component Value Date   WBC 5.5 08/18/2013   HGB 12.2 08/18/2013   HCT 36.2 08/18/2013   MCV 96.5 08/18/2013   PLT 170 08/18/2013      Chemistry      Component Value Date/Time   NA 138 08/18/2013 1335   NA 131* 04/30/2007 1338   K 4.2 08/18/2013 1335   K 3.6 04/30/2007 1338   CL 97 04/30/2007 1338   CO2 27 08/18/2013 1335   CO2 23 04/30/2007 1338   BUN 17.1 08/18/2013 1335   BUN 13 04/30/2007 1338   CREATININE 0.8 08/18/2013 1335   CREATININE 0.58 04/30/2007 1338      Component Value Date/Time   CALCIUM 9.5 08/18/2013 1335   CALCIUM 9.4 04/30/2007 1338   ALKPHOS 67 08/18/2013 1335   AST 17 08/18/2013 1335   ALT 15 08/18/2013 1335   BILITOT 0.44 08/18/2013 1335       RADIOGRAPHIC STUDIES: Mr Breast Bilateral W Wo Contrast  08/05/2013   CLINICAL DATA:  48 year old female who recently underwent a left lumpectomy on 07/07/2013 showing lobular carcinoma in situ, fibrocystic changes and PASH. Dense fibroglandular tissue.  LABS:  None.  EXAM: BILATERAL BREAST MRI WITH AND WITHOUT CONTRAST  TECHNIQUE: Multiplanar, multisequence MR images of both breasts were obtained prior to  and following the intravenous administration of 70m of MultiHance.  THREE-DIMENSIONAL MR IMAGE RENDERING ON INDEPENDENT WORKSTATION:  Three-dimensional MR images were rendered by post-processing of the original MR data on an independent workstation. The three-dimensional MR images were interpreted, and findings are reported in the following complete MRI report for this study. Three dimensional images were evaluated at the independent DynaCad workstation  COMPARISON:  Previous exams  FINDINGS: Breast composition: c:  Heterogeneous fibroglandular tissue  Background parenchymal enhancement: Marked  Right breast: There is a 7 x 5 x 4 mm enhancing, well-circumscribed nodule in the upper inner quadrant of the right breast.  Left breast: Lumpectomy changes are seen in the upper-outer quadrant of the left breast. There is no abnormal enhancement.  Lymph nodes: No abnormal appearing lymph nodes.  Ancillary findings:  None.  IMPRESSION: Indeterminate enhancing nodule in the upper-inner quadrant of the right breast.  RECOMMENDATION: Further evaluation the right breast with ultrasound is recommended. The patient  will be contacted for the additional imaging evaluation. If the lesion has a benign appearance on ultrasound short-term interval followup could be performed in 6 months versus ultrasound-guided core biopsy.  BI-RADS CATEGORY  0: Incomplete. Need additional imaging evaluation and/or prior mammograms for comparison.   Electronically Signed   By: Lillia Mountain M.D.   On: 08/05/2013 12:31   Mm Digital Diagnostic Unilat R  08/18/2013   ADDENDUM REPORT: 08/18/2013 12:12  ADDENDUM: Addendum by Dr. Conchita Paris on 08/18/2013 at 12:10 p.m. I spoke with the patient by telephone today to discuss her pathology results. Pathology of right breast biopsy demonstrates benign fibrocystic change, which is concordant with the imaging appearance. The patient has a previous history of atypical lobular hyperplasia and LCIS in the left  breast. Annual surveillance screening mammography is recommended March 2016. MRI may be helpful if the patient's lifetime calculated risk of breast cancer is greater than or equal to 20 percent according to [place] consensus guidelines. She has an appointment today at the high risk clinic. All questions were answered and she reports no problems at the biopsy site.   Electronically Signed   By: Conchita Paris M.D.   On: 08/18/2013 12:12   08/18/2013   CLINICAL DATA:  Mass 1 o'clock position right breast anteriorly  EXAM: DIAGNOSTIC RIGHT MAMMOGRAM POST ULTRASOUND BIOPSY  COMPARISON:  Previous exams  FINDINGS: Mammographic images were obtained following ultrasound guided biopsy of mass in the 1 o'clock position of the right breast anteriorly. Coil shaped marker clip is located in appropriate position on both images.  IMPRESSION: Appropriate clip placement.  Final Assessment: Post Procedure Mammograms for Marker Placement  Electronically Signed: By: Skipper Cliche M.D. On: 08/17/2013 14:12   US Breast Ltd Uni Right Inc Axilla  08/17/2013   CLINICAL DATA:  Mass identified 1 o'clock anterior right breast on MRI, for second look ultrasound ; patient with diagnosis of lobular carcinoma in situ on excisional biopsy of left breast  EXAM: ULTRASOUND OF THE RIGHT BREAST  COMPARISON:  Prior studies  FINDINGS: On physical exam, there are no palpable abnormalities  Ultrasound is performed, showing an oval circumscribed parallel hypoechoic heterogeneous mass with a mixture of posterior acoustic enhancement and shadowing. This is seen in the 1 o'clock position of the right breast 1 cm from the nipple. It measures 8 x 4 x 8 mm.  IMPRESSION: Solid right breast mass  RECOMMENDATION: Ultrasound-guided core needle biopsy recommended  I have discussed the findings and recommendations with the patient. Results were also provided in writing at the conclusion of the visit. If applicable, a reminder letter will be sent  to the patient regarding the next appointment.  BI-RADS CATEGORY  4: Suspicious abnormality - biopsy should be considered.   Electronically Signed   By: Skipper Cliche M.D.   On: 08/17/2013 13:33   [place] Rt Breast Bx W Loc Dev 1st Lesion Img Bx Spec US Guide  08/17/2013   CLINICAL DATA:  Mass periareolar 1 o'clock right breast  EXAM: ULTRASOUND GUIDED RIGHT BREAST CORE NEEDLE BIOPSY  COMPARISON:  Previous exams.  PROCEDURE: I met with the patient and we discussed the procedure of ultrasound-guided biopsy, including benefits and alternatives. We discussed the high likelihood of a successful procedure. We discussed the risks of the procedure including infection, bleeding, tissue injury, clip migration, and inadequate sampling. Informed written consent was given. The usual time-out protocol was performed immediately prior to the procedure.  Using sterile technique and 2% Lidocaine as  local anesthetic, under direct ultrasound visualization, a 12 gauge vacuum-assisteddevice was used to perform biopsy of the right breast mass using a lateral medial approach. At the conclusion of the procedure, a tissue marker clip was deployed into the biopsy cavity. Follow-up 2-view mammogram was performed and dictated separately.  IMPRESSION: Ultrasound-guided biopsy of right breast mass. No apparent complications.   Electronically Signed   By: Skipper Cliche M.D.   On: 08/17/2013 13:36     ASSESSMENT/PLAN: Lobular carcinoma In situ /fibrocystic disease/atypical lobular hyperplasia/PASH of the left breast status post lumpectomy in May 2015. Status post biopsy of the right breast suspicious nodule and was negative for any malignancy or precancerous lesions:  I have discussed in detail her pathology reports with the patient and her husband and I went over the risk of invasive cancer with lobular carcinoma in situ. I have also discussed the risk reduction strategies including antiestrogen therapy and explained in detail the  benefits and side effects of antiestrogen therapy especially in her case with mixed connective tissue disorder and celiac disease.  Patient and her husband understood the same and in agreement and comfortable with observation alone. We will monitor her condition with close surveillance that would be physical examination every 6-12 months and annual diagnostic mammogram in her case with bilateral sonogram in March 2016. If needed will also obtain MRI of the breasts after reviewing repeat mammogram and sonogram in March 2016 I have encouraged her monthly self breast examination and continue to follow a healthy diet and exercise and asked the patient to avoid alcohol intake  I've asked the patient to follow up with Dr. Dalbert Batman as scheduled  Next follow up visit in March 2016  All questions were answered. The patient knows to call the clinic with any problems, questions or concerns. We can certainly see the patient much sooner if necessary.  Thank you so much for allowing me to participate in the care of Wendy Waller.  We will continue to follow up the patient with you and assist in her care.  I spent 50% of the time counseling the patient face to face. The total time spent in the appointment was 50 minutes   [place], [facility] 08/18/2013, 2:53 PM

## 2013-08-18 NOTE — Telephone Encounter (Signed)
per pof to sch pt appt & mamma-per PK make appt after the mamma in 04/2014-gave pt copy of sch

## 2013-12-11 ENCOUNTER — Telehealth: Payer: Self-pay | Admitting: Hematology and Oncology

## 2013-12-11 NOTE — Telephone Encounter (Signed)
S/w pt advised appt chg from 3/28 (md pal) to 4/5. Pt verbalized understanding.

## 2014-04-14 ENCOUNTER — Other Ambulatory Visit: Payer: Self-pay | Admitting: Hematology and Oncology

## 2014-04-14 DIAGNOSIS — N6092 Unspecified benign mammary dysplasia of left breast: Secondary | ICD-10-CM

## 2014-05-10 ENCOUNTER — Ambulatory Visit: Payer: BC Managed Care – PPO | Admitting: Hematology and Oncology

## 2014-05-10 ENCOUNTER — Other Ambulatory Visit: Payer: BC Managed Care – PPO

## 2014-05-17 ENCOUNTER — Ambulatory Visit: Payer: BC Managed Care – PPO

## 2014-05-17 ENCOUNTER — Other Ambulatory Visit: Payer: Self-pay | Admitting: Hematology and Oncology

## 2014-05-17 ENCOUNTER — Ambulatory Visit
Admission: RE | Admit: 2014-05-17 | Discharge: 2014-05-17 | Disposition: A | Discharge status: Home / Self Care | Payer: BLUE CROSS/BLUE SHIELD | Source: Ambulatory Visit | Attending: Hematology and Oncology | Admitting: Hematology and Oncology

## 2014-05-17 ENCOUNTER — Other Ambulatory Visit: Payer: Self-pay

## 2014-05-17 DIAGNOSIS — N6092 Unspecified benign mammary dysplasia of left breast: Secondary | ICD-10-CM

## 2014-05-24 ENCOUNTER — Ambulatory Visit: Payer: BC Managed Care – PPO | Admitting: Hematology and Oncology

## 2014-05-24 ENCOUNTER — Other Ambulatory Visit: Payer: BC Managed Care – PPO

## 2014-06-01 ENCOUNTER — Telehealth: Payer: Self-pay | Admitting: Hematology and Oncology

## 2014-06-01 ENCOUNTER — Other Ambulatory Visit: Payer: Self-pay

## 2014-06-01 ENCOUNTER — Other Ambulatory Visit (HOSPITAL_BASED_OUTPATIENT_CLINIC_OR_DEPARTMENT_OTHER): Payer: BLUE CROSS/BLUE SHIELD

## 2014-06-01 ENCOUNTER — Ambulatory Visit (HOSPITAL_BASED_OUTPATIENT_CLINIC_OR_DEPARTMENT_OTHER): Payer: BLUE CROSS/BLUE SHIELD | Admitting: Hematology and Oncology

## 2014-06-01 VITALS — BP 136/85 | HR 70 | Temp 98.2°F | Resp 18 | Ht 61.0 in | Wt 120.0 lb

## 2014-06-01 DIAGNOSIS — D0512 Intraductal carcinoma in situ of left breast: Secondary | ICD-10-CM

## 2014-06-01 DIAGNOSIS — N6092 Unspecified benign mammary dysplasia of left breast: Secondary | ICD-10-CM

## 2014-06-01 DIAGNOSIS — N6012 Diffuse cystic mastopathy of left breast: Secondary | ICD-10-CM

## 2014-06-01 LAB — COMPREHENSIVE METABOLIC PANEL (CC13)
ALK PHOS: 55 U/L (ref 40–150)
ALT: 15 U/L (ref 0–55)
AST: 17 U/L (ref 5–34)
Albumin: 4.1 g/dL (ref 3.5–5.0)
Anion Gap: 11 mEq/L (ref 3–11)
BILIRUBIN TOTAL: 0.26 mg/dL (ref 0.20–1.20)
BUN: 14.7 mg/dL (ref 7.0–26.0)
CO2: 26 mEq/L (ref 22–29)
Calcium: 8.8 mg/dL (ref 8.4–10.4)
Chloride: 103 mEq/L (ref 98–109)
Creatinine: 0.8 mg/dL (ref 0.6–1.1)
EGFR: >90 mL/min/{1.73_m2} (ref >90)
Glucose: 87 mg/dl (ref 70–140)
POTASSIUM: 4 meq/L (ref 3.5–5.1)
Sodium: 139 mEq/L (ref 136–145)
Total Protein: 7.4 g/dL (ref 6.4–8.3)

## 2014-06-01 LAB — CBC WITH DIFFERENTIAL/PLATELET
BASO%: 0.9 % (ref 0.0–2.0)
Basophils Absolute: 0 10*3/uL (ref 0.0–0.1)
EOS%: 7.1 % — ABNORMAL HIGH (ref 0.0–7.0)
Eosinophils Absolute: 0.3 10*3/uL (ref 0.0–0.5)
HCT: 36.9 % (ref 34.8–46.6)
HGB: 13 g/dL (ref 11.6–15.9)
LYMPH#: 1 10*3/uL (ref 0.9–3.3)
LYMPH%: 20.4 % (ref 14.0–49.7)
MCH: 33.2 pg (ref 25.1–34.0)
MCHC: 35.2 g/dL (ref 31.5–36.0)
MCV: 94.1 fL (ref 79.5–101.0)
MONO#: 0.4 10*3/uL (ref 0.1–0.9)
MONO%: 8.8 % (ref 0.0–14.0)
NEUT#: 2.9 10*3/uL (ref 1.5–6.5)
NEUT%: 62.8 % (ref 38.4–76.8)
Platelets: 187 10*3/uL (ref 145–400)
RBC: 3.92 10*6/uL (ref 3.70–5.45)
RDW: 12.4 % (ref 11.2–14.5)
WBC: 4.7 10*3/uL (ref 3.9–10.3)

## 2014-06-01 NOTE — Assessment & Plan Note (Addendum)
Lobular carcinoma In situ /fibrocystic disease/atypical lobular hyperplasia/PASH of the left breast status post lumpectomy in May 2015. Status post biopsy of the right breast suspicious nodule and was negative for any malignancy or precancerous lesions; currently on observation  Breast cancer surveillance: 1. Breast exam 06/01/2014 is normal 2. 3D mammogram 05/17/2014 is normal  Counseling: I discussed with her extensively that surveillance for her LCIS/ALH is primarily with physical exams of breast every 6 months and once a year 3-D mammogram. I do not believe there is any data to support MRIs or ultrasound without any additional high risk family history for BRCA mutation.  I counseled them extensively about breast density and the current guidelines and recommendations. They were very happy with the discussion and will plan to follow-up every 6 months with [place]. I discussed that after few years we can then change to once a year follow-ups.  Return to clinic in 6 months for follow-up

## 2014-06-01 NOTE — Telephone Encounter (Signed)
Appointments made and avs printed for patient °

## 2014-06-01 NOTE — Progress Notes (Signed)
Patient Care Team: Gaynelle Arabian, MD as PCP - General (Family Medicine)  DIAGNOSIS: LCIS left breast CHIEF COMPLIANT:  Follow-up of LCIS surveillance  INTERVAL HISTORY: Wendy Waller is a 49 year old with above-mentioned history of left breast LCIS who is currently in surveillance with [place]. She had a recent mammogram on March 21 which was normal. She denies any new problems or concerns. She feels that the scar is slightly sensitive and lumpy to palpation. She does not know what to expect and how her breast cancer would feel. She is otherwise doing well she has tenderness 3 times a week and then performs exercises the rest of the week.  REVIEW OF SYSTEMS:   Constitutional: Denies fevers, chills or abnormal weight loss Eyes: Denies blurriness of vision Ears, nose, mouth, throat, and face: Denies mucositis or sore throat Respiratory: Denies cough, dyspnea or wheezes Cardiovascular: Denies palpitation, chest discomfort or lower extremity swelling Gastrointestinal:  Denies nausea, heartburn or change in bowel habits Skin: Denies abnormal skin rashes Lymphatics: Denies new lymphadenopathy or easy bruising Neurological:Denies numbness, tingling or new weaknesses Behavioral/Psych: Mood is stable, no new changes  Breast:  denies any pain or lumps or nodules in either breasts All other systems were reviewed with the patient and are negative.  I have reviewed the past medical history, past surgical history, social history and family history with the patient and they are unchanged from previous note.  ALLERGIES:  has No Known Allergies.  MEDICATIONS:  Current Outpatient Prescriptions  Medication Sig Dispense Refill  . cetirizine (ZYRTEC) 10 MG tablet Take 10 mg by mouth daily.    . diclofenac (VOLTAREN) 75 MG EC tablet     . diphenhydrAMINE (SOMINEX) 25 MG tablet Take 25 mg by mouth at bedtime as needed for sleep.    . hydroxychloroquine (PLAQUENIL) 200 MG tablet Take 200 mg by mouth 2 (two)  times daily.     No current facility-administered medications for this visit.    PHYSICAL EXAMINATION: ECOG PERFORMANCE STATUS: 0 - Asymptomatic  Filed Vitals:   06/01/14 1444  BP: 136/85  Pulse: 70  Temp: 98.2 F (36.8 C)  Resp: 18   Filed Weights   06/01/14 1444  Weight: 120 lb (54.432 kg)    GENERAL:alert, no distress and comfortable SKIN: skin color, texture, turgor are normal, no rashes or significant lesions EYES: normal, Conjunctiva are pink and non-injected, sclera clear OROPHARYNX:no exudate, no erythema and lips, buccal mucosa, and tongue normal  NECK: supple, thyroid normal size, non-tender, without nodularity LYMPH:  no palpable lymphadenopathy in the cervical, axillary or inguinal LUNGS: clear to auscultation and percussion with normal breathing effort HEART: regular rate & rhythm and no murmurs and no lower extremity edema ABDOMEN:abdomen soft, non-tender and normal bowel sounds Musculoskeletal:no cyanosis of digits and no clubbing  NEURO: alert & oriented x 3 with fluent speech, no focal motor/sensory deficits BREAST: No palpable masses or nodules in either right or left breasts. Scar tissue around the left breast is palpated and slightly nodular which is normal. No palpable axillary supraclavicular or infraclavicular adenopathy no breast tenderness or nipple discharge. (exam performed in the presence of a chaperone)  LABORATORY DATA:  I have reviewed the data as listed   Chemistry      Component Value Date/Time   NA 139 06/01/2014 1419   NA 131* 04/30/2007 1338   K 4.0 06/01/2014 1419   K 3.6 04/30/2007 1338   CL 97 04/30/2007 1338   CO2 26 06/01/2014 1419   CO2  23 04/30/2007 1338   BUN 14.7 06/01/2014 1419   BUN 13 04/30/2007 1338   CREATININE 0.8 06/01/2014 1419   CREATININE 0.58 04/30/2007 1338      Component Value Date/Time   CALCIUM 8.8 06/01/2014 1419   CALCIUM 9.4 04/30/2007 1338   ALKPHOS 55 06/01/2014 1419   AST 17 06/01/2014 1419   ALT  15 06/01/2014 1419   BILITOT 0.26 06/01/2014 1419       Lab Results  Component Value Date   WBC 4.7 06/01/2014   HGB 13.0 06/01/2014   HCT 36.9 06/01/2014   MCV 94.1 06/01/2014   PLT 187 06/01/2014   NEUTROABS 2.9 06/01/2014    ASSESSMENT & PLAN:  Atypical lobular hyperplasia of left breast Lobular carcinoma In situ /fibrocystic disease/atypical lobular hyperplasia/PASH of the left breast status post lumpectomy in May 2015. Status post biopsy of the right breast suspicious nodule and was negative for any malignancy or precancerous lesions; currently on observation  Breast cancer surveillance: 1. Breast exam 06/01/2014 is normal 2. 3D mammogram 05/17/2014 is normal  Counseling: I discussed with her extensively that surveillance for her LCIS/ALH is primarily with physical exams of breast every 6 months and once a year 3-D mammogram. I do not believe there is any data to support MRIs or ultrasound without any additional high risk family history for BRCA mutation.  I counseled them extensively about breast density and the current guidelines and recommendations. They were very happy with the discussion and will plan to follow-up every 6 months with [place]. I discussed that after few years we can then change to once a year follow-ups.  Return to clinic in 6 months for follow-up     No orders of the defined types were placed in this encounter.   The patient has a good understanding of the overall plan. she agrees with it. She will call with any problems that may develop before her next visit here.   Rulon Eisenmenger, MD

## 2014-12-01 NOTE — Assessment & Plan Note (Signed)
Lobular carcinoma In situ /fibrocystic disease/atypical lobular hyperplasia/PASH of the left breast status post lumpectomy in May 2015. Status post biopsy of the right breast suspicious nodule and was negative for any malignancy or precancerous lesions; currently on observation  Breast cancer surveillance: 1. Breast exam 12/01/2014 is normal 2. 3D mammogram 05/17/2014 is normal  Counseling: I discussed with her extensively that surveillance for her LCIS/ALH is primarily with physical exams of breast every 6 months and once a year 3-D mammogram. I do not believe there is any data to support MRIs or ultrasound without any additional high risk family history for BRCA mutation.  Return to clinic in 6 months for follow-up

## 2014-12-02 ENCOUNTER — Encounter: Payer: Self-pay | Admitting: Hematology and Oncology

## 2014-12-02 ENCOUNTER — Telehealth: Payer: Self-pay | Admitting: Hematology and Oncology

## 2014-12-02 ENCOUNTER — Ambulatory Visit (HOSPITAL_BASED_OUTPATIENT_CLINIC_OR_DEPARTMENT_OTHER): Payer: BLUE CROSS/BLUE SHIELD | Admitting: Hematology and Oncology

## 2014-12-02 VITALS — BP 134/83 | HR 68 | Temp 98.3°F | Resp 18 | Ht 61.0 in | Wt 124.3 lb

## 2014-12-02 DIAGNOSIS — N6092 Unspecified benign mammary dysplasia of left breast: Secondary | ICD-10-CM

## 2014-12-02 DIAGNOSIS — Z853 Personal history of malignant neoplasm of breast: Secondary | ICD-10-CM

## 2014-12-02 NOTE — Progress Notes (Signed)
Patient Care Team: Gaynelle Arabian, MD as PCP - General (Family Medicine)  DIAGNOSIS: [facility] Left breast.  CHIEF COMPLIANT: follow-up of any serious  INTERVAL HISTORY: Wendy Waller is a 49 year old with above-mentioned history of LCIS currently on surveillance observation. She reports that she is slightly uncomfortable in the breast where she had surgery but there is no pain. She denies any lumps or nodules. She is otherwise doing well without any problems or concerns.  REVIEW OF SYSTEMS:   Constitutional: Denies fevers, chills or abnormal weight loss Eyes: Denies blurriness of vision Ears, nose, mouth, throat, and face: Denies mucositis or sore throat Respiratory: Denies cough, dyspnea or wheezes Cardiovascular: Denies palpitation, chest discomfort or lower extremity swelling Gastrointestinal:  Denies nausea, heartburn or change in bowel habits Skin: Denies abnormal skin rashes Lymphatics: Denies new lymphadenopathy or easy bruising Neurological:Denies numbness, tingling or new weaknesses Behavioral/Psych: Mood is stable, no new changes  Breast: slight discomfort in the left breast from prior surgery All other systems were reviewed with the patient and are negative.  I have reviewed the past medical history, past surgical history, social history and family history with the patient and they are unchanged from previous note.  ALLERGIES:  has No Known Allergies.  MEDICATIONS:  Current Outpatient Prescriptions  Medication Sig Dispense Refill  . cetirizine (ZYRTEC) 10 MG tablet Take 10 mg by mouth daily.    . diclofenac (VOLTAREN) 75 MG EC tablet     . diphenhydrAMINE (SOMINEX) 25 MG tablet Take 25 mg by mouth at bedtime as needed for sleep.    . hydroxychloroquine (PLAQUENIL) 200 MG tablet Take 200 mg by mouth 2 (two) times daily.     No current facility-administered medications for this visit.    PHYSICAL EXAMINATION: ECOG PERFORMANCE STATUS: 1 - Symptomatic but completely  ambulatory  Filed Vitals:   12/02/14 1354  BP: 134/83  Pulse: 68  Temp: 98.3 F (36.8 C)  Resp: 18   Filed Weights   12/02/14 1354  Weight: 124 lb 4.8 oz (56.382 kg)    GENERAL:alert, no distress and comfortable SKIN: skin color, texture, turgor are normal, no rashes or significant lesions EYES: normal, Conjunctiva are pink and non-injected, sclera clear OROPHARYNX:no exudate, no erythema and lips, buccal mucosa, and tongue normal  NECK: supple, thyroid normal size, non-tender, without nodularity LYMPH:  no palpable lymphadenopathy in the cervical, axillary or inguinal LUNGS: clear to auscultation and percussion with normal breathing effort HEART: regular rate & rhythm and no murmurs and no lower extremity edema ABDOMEN:abdomen soft, non-tender and normal bowel sounds Musculoskeletal:no cyanosis of digits and no clubbing  NEURO: alert & oriented x 3 with fluent speech, no focal motor/sensory deficits BREAST: No palpable masses or nodules in either right or left breasts. No palpable axillary supraclavicular or infraclavicular adenopathy no breast tenderness or nipple discharge. (exam performed in the presence of a chaperone)  LABORATORY DATA:  I have reviewed the data as listed   Chemistry      Component Value Date/Time   NA 139 06/01/2014 1419   NA 131* 04/30/2007 1338   K 4.0 06/01/2014 1419   K 3.6 04/30/2007 1338   CL 97 04/30/2007 1338   CO2 26 06/01/2014 1419   CO2 23 04/30/2007 1338   BUN 14.7 06/01/2014 1419   BUN 13 04/30/2007 1338   CREATININE 0.8 06/01/2014 1419   CREATININE 0.58 04/30/2007 1338      Component Value Date/Time   CALCIUM 8.8 06/01/2014 1419   CALCIUM 9.4 04/30/2007  1338   ALKPHOS 55 06/01/2014 1419   AST 17 06/01/2014 1419   ALT 15 06/01/2014 1419   BILITOT 0.26 06/01/2014 1419       Lab Results  Component Value Date   WBC 4.7 06/01/2014   HGB 13.0 06/01/2014   HCT 36.9 06/01/2014   MCV 94.1 06/01/2014   PLT 187 06/01/2014    NEUTROABS 2.9 06/01/2014   ASSESSMENT & PLAN:  Atypical lobular hyperplasia of left breast Lobular carcinoma In situ /fibrocystic disease/atypical lobular hyperplasia/PASH of the left breast status post lumpectomy in May 2015. Status post biopsy of the right breast suspicious nodule and was negative for any malignancy or precancerous lesions; currently on observation  Breast cancer surveillance: 1. Breast exam 12/02/2014 is normal 2. 3D mammogram 05/17/2014 is normal  Counseling: I discussed with her extensively that surveillance for her LCIS/ALH is primarily with physical exams of breast every 6 months and once a year 3-D mammogram. I do not believe there is any data to support MRIs or ultrasound without any additional high risk family history for BRCA mutation.  Return to clinic in 1 year for follow-up   No orders of the defined types were placed in this encounter.   The patient has a good understanding of the overall plan. she agrees with it. she will call with any problems that may develop before the next visit here.   Rulon Eisenmenger, MD

## 2014-12-02 NOTE — Addendum Note (Signed)
Addended by: Prentiss Bells on: 12/02/2014 02:16 PM   Modules accepted: Medications

## 2014-12-02 NOTE — Telephone Encounter (Signed)
Appointments made and avs printed for pateint °

## 2015-04-13 ENCOUNTER — Other Ambulatory Visit: Payer: Self-pay

## 2015-04-13 DIAGNOSIS — Z1231 Encounter for screening mammogram for malignant neoplasm of breast: Secondary | ICD-10-CM

## 2015-05-18 ENCOUNTER — Ambulatory Visit
Admission: RE | Admit: 2015-05-18 | Discharge: 2015-05-18 | Disposition: A | Discharge status: Home / Self Care | Payer: BLUE CROSS/BLUE SHIELD | Source: Ambulatory Visit

## 2015-05-18 DIAGNOSIS — Z1231 Encounter for screening mammogram for malignant neoplasm of breast: Secondary | ICD-10-CM

## 2015-08-23 DIAGNOSIS — M25562 Pain in left knee: Secondary | ICD-10-CM | POA: Diagnosis not present

## 2015-08-29 DIAGNOSIS — M2550 Pain in unspecified joint: Secondary | ICD-10-CM | POA: Diagnosis not present

## 2015-08-29 DIAGNOSIS — Z79899 Other long term (current) drug therapy: Secondary | ICD-10-CM | POA: Diagnosis not present

## 2015-08-29 DIAGNOSIS — M064 Inflammatory polyarthropathy: Secondary | ICD-10-CM | POA: Diagnosis not present

## 2015-11-09 DIAGNOSIS — Z23 Encounter for immunization: Secondary | ICD-10-CM | POA: Diagnosis not present

## 2015-12-01 ENCOUNTER — Ambulatory Visit: Payer: BLUE CROSS/BLUE SHIELD | Admitting: Hematology and Oncology

## 2015-12-12 ENCOUNTER — Ambulatory Visit: Payer: BLUE CROSS/BLUE SHIELD | Admitting: Hematology and Oncology

## 2015-12-13 ENCOUNTER — Ambulatory Visit (HOSPITAL_BASED_OUTPATIENT_CLINIC_OR_DEPARTMENT_OTHER): Payer: BLUE CROSS/BLUE SHIELD | Admitting: Hematology and Oncology

## 2015-12-13 ENCOUNTER — Encounter: Payer: Self-pay | Admitting: Hematology and Oncology

## 2015-12-13 DIAGNOSIS — Z86000 Personal history of in-situ neoplasm of breast: Secondary | ICD-10-CM | POA: Diagnosis not present

## 2015-12-13 DIAGNOSIS — N6092 Unspecified benign mammary dysplasia of left breast: Secondary | ICD-10-CM | POA: Diagnosis not present

## 2015-12-13 NOTE — Progress Notes (Signed)
Patient Care Team: Gaynelle Arabian, MD as PCP - General (Family Medicine)  DIAGNOSIS: LCIS/ALH Left breast.  CHIEF COMPLIANT: follow-up of LCIS/ALH  INTERVAL HISTORY: Wendy Waller is a 50 year old with above-mentioned history of LCIS/ALH currently on surveillance observation. She reports that she is slightly uncomfortable in the breast where she had surgery but there is no pain. She denies any lumps or nodules. She is otherwise doing well without any problems or concerns.  REVIEW OF SYSTEMS:   Constitutional: Denies fevers, chills or abnormal weight loss Eyes: Denies blurriness of vision Ears, nose, mouth, throat, and face: Denies mucositis or sore throat Respiratory: Denies cough, dyspnea or wheezes Cardiovascular: Denies palpitation, chest discomfort Gastrointestinal:  Denies nausea, heartburn or change in bowel habits Skin: Denies abnormal skin rashes Lymphatics: Denies new lymphadenopathy or easy bruising Neurological:Denies numbness, tingling or new weaknesses Behavioral/Psych: Mood is stable, no new changes  Extremities: No lower extremity edema Breast:  denies any pain or lumps or nodules in either breasts All other systems were reviewed with the patient and are negative.  I have reviewed the past medical history, past surgical history, social history and family history with the patient and they are unchanged from previous note.  ALLERGIES:  has No Known Allergies.  MEDICATIONS:  Current Outpatient Prescriptions  Medication Sig Dispense Refill  . cetirizine (ZYRTEC) 10 MG tablet Take 10 mg by mouth daily.    . diclofenac (VOLTAREN) 75 MG EC tablet     . diphenhydrAMINE (SOMINEX) 25 MG tablet Take 25 mg by mouth at bedtime as needed for sleep.    . hydroxychloroquine (PLAQUENIL) 200 MG tablet Take 200 mg by mouth 2 (two) times daily.     No current facility-administered medications for this visit.     PHYSICAL EXAMINATION: ECOG PERFORMANCE STATUS: 0 -  Asymptomatic  There were no vitals filed for this visit. There were no vitals filed for this visit.  GENERAL:alert, no distress and comfortable SKIN: skin color, texture, turgor are normal, no rashes or significant lesions EYES: normal, Conjunctiva are pink and non-injected, sclera clear OROPHARYNX:no exudate, no erythema and lips, buccal mucosa, and tongue normal  NECK: supple, thyroid normal size, non-tender, without nodularity LYMPH:  no palpable lymphadenopathy in the cervical, axillary or inguinal LUNGS: clear to auscultation and percussion with normal breathing effort HEART: regular rate & rhythm and no murmurs and no lower extremity edema ABDOMEN:abdomen soft, non-tender and normal bowel sounds MUSCULOSKELETAL:no cyanosis of digits and no clubbing  NEURO: alert & oriented x 3 with fluent speech, no focal motor/sensory deficits EXTREMITIES: No lower extremity edema BREAST: No palpable masses or nodules in either right or left breasts. No palpable axillary supraclavicular or infraclavicular adenopathy no breast tenderness or nipple discharge. (exam performed in the presence of a chaperone)  LABORATORY DATA:  I have reviewed the data as listed   Chemistry      Component Value Date/Time   NA 139 06/01/2014 1419   K 4.0 06/01/2014 1419   CL 97 04/30/2007 1338   CO2 26 06/01/2014 1419   BUN 14.7 06/01/2014 1419   CREATININE 0.8 06/01/2014 1419      Component Value Date/Time   CALCIUM 8.8 06/01/2014 1419   ALKPHOS 55 06/01/2014 1419   AST 17 06/01/2014 1419   ALT 15 06/01/2014 1419   BILITOT 0.26 06/01/2014 1419      Lab Results  Component Value Date   WBC 4.7 06/01/2014   HGB 13.0 06/01/2014   HCT 36.9 06/01/2014  MCV 94.1 06/01/2014   PLT 187 06/01/2014   NEUTROABS 2.9 06/01/2014   ASSESSMENT & PLAN:  Atypical lobular hyperplasia of left breast Lobular carcinoma In situ /fibrocystic disease/atypical lobular hyperplasia/PASH of the left breast status post  lumpectomy in May 2015. Status post biopsy of the right breast suspicious nodule and was negative for any malignancy or precancerous lesions; currently on observation  Breast cancer surveillance: 1. Breast exam 12/13/2015 is normal 2. 3D mammogram 05/18/2015 is normal, breast density category C  Counseling: I discussed with her extensively that surveillance for her LCIS/ALH is primarily with physical exams of breast every 6 months and once a year 3-D mammogram. I do not believe there is any data to support MRIs or ultrasound without any additional high risk family history for BRCA mutation.  Return to clinic in 1 year for follow-up   No orders of the defined types were placed in this encounter.  The patient has a good understanding of the overall plan. she agrees with it. she will call with any problems that may develop before the next visit here.   Rulon Eisenmenger, MD 12/13/15

## 2015-12-13 NOTE — Progress Notes (Signed)
Received request from tech to enter vital signs as chart already closed.  Addendum created and vitals charted as reported to this RN.

## 2015-12-13 NOTE — Assessment & Plan Note (Signed)
Lobular carcinoma In situ /fibrocystic disease/atypical lobular hyperplasia/PASH of the left breast status post lumpectomy in May 2015. Status post biopsy of the right breast suspicious nodule and was negative for any malignancy or precancerous lesions; currently on observation  Breast cancer surveillance: 1. Breast exam 12/13/2015 is normal 2. 3D mammogram 05/18/2015 is normal, breast density category C  Counseling: I discussed with her extensively that surveillance for her LCIS/ALH is primarily with physical exams of breast every 6 months and once a year 3-D mammogram. I do not believe there is any data to support MRIs or ultrasound without any additional high risk family history for BRCA mutation.  Return to clinic in 1 year for follow-up

## 2016-01-06 DIAGNOSIS — H02839 Dermatochalasis of unspecified eye, unspecified eyelid: Secondary | ICD-10-CM | POA: Diagnosis not present

## 2016-01-06 DIAGNOSIS — H2513 Age-related nuclear cataract, bilateral: Secondary | ICD-10-CM | POA: Diagnosis not present

## 2016-01-06 DIAGNOSIS — H524 Presbyopia: Secondary | ICD-10-CM | POA: Diagnosis not present

## 2016-02-27 HISTORY — PX: COLONOSCOPY WITH ESOPHAGOGASTRODUODENOSCOPY (EGD): SHX5779

## 2016-02-29 DIAGNOSIS — M2550 Pain in unspecified joint: Secondary | ICD-10-CM | POA: Diagnosis not present

## 2016-02-29 DIAGNOSIS — M064 Inflammatory polyarthropathy: Secondary | ICD-10-CM | POA: Diagnosis not present

## 2016-02-29 DIAGNOSIS — R768 Other specified abnormal immunological findings in serum: Secondary | ICD-10-CM | POA: Diagnosis not present

## 2016-02-29 DIAGNOSIS — Z79899 Other long term (current) drug therapy: Secondary | ICD-10-CM | POA: Diagnosis not present

## 2016-03-16 ENCOUNTER — Other Ambulatory Visit: Payer: Self-pay | Admitting: Family Medicine

## 2016-03-16 DIAGNOSIS — Z841 Family history of disorders of kidney and ureter: Secondary | ICD-10-CM

## 2016-03-19 ENCOUNTER — Other Ambulatory Visit: Payer: BLUE CROSS/BLUE SHIELD

## 2016-03-20 ENCOUNTER — Ambulatory Visit
Admission: RE | Admit: 2016-03-20 | Discharge: 2016-03-20 | Disposition: A | Discharge status: Home / Self Care | Payer: BLUE CROSS/BLUE SHIELD | Source: Ambulatory Visit | Attending: Family Medicine | Admitting: Family Medicine

## 2016-03-20 DIAGNOSIS — Z841 Family history of disorders of kidney and ureter: Secondary | ICD-10-CM

## 2016-03-20 DIAGNOSIS — N2889 Other specified disorders of kidney and ureter: Secondary | ICD-10-CM | POA: Diagnosis not present

## 2016-04-10 ENCOUNTER — Other Ambulatory Visit: Payer: Self-pay | Admitting: Family Medicine

## 2016-04-10 DIAGNOSIS — Z1231 Encounter for screening mammogram for malignant neoplasm of breast: Secondary | ICD-10-CM

## 2016-04-26 DIAGNOSIS — R030 Elevated blood-pressure reading, without diagnosis of hypertension: Secondary | ICD-10-CM | POA: Diagnosis not present

## 2016-04-26 DIAGNOSIS — M792 Neuralgia and neuritis, unspecified: Secondary | ICD-10-CM | POA: Diagnosis not present

## 2016-04-26 DIAGNOSIS — M542 Cervicalgia: Secondary | ICD-10-CM | POA: Diagnosis not present

## 2016-04-27 DIAGNOSIS — R030 Elevated blood-pressure reading, without diagnosis of hypertension: Secondary | ICD-10-CM | POA: Diagnosis not present

## 2016-05-18 ENCOUNTER — Ambulatory Visit
Admission: RE | Admit: 2016-05-18 | Discharge: 2016-05-18 | Disposition: A | Discharge status: Home / Self Care | Payer: BLUE CROSS/BLUE SHIELD | Source: Ambulatory Visit | Attending: Family Medicine | Admitting: Family Medicine

## 2016-05-18 DIAGNOSIS — Z1231 Encounter for screening mammogram for malignant neoplasm of breast: Secondary | ICD-10-CM

## 2016-05-22 DIAGNOSIS — I10 Essential (primary) hypertension: Secondary | ICD-10-CM | POA: Diagnosis not present

## 2016-05-22 DIAGNOSIS — Z209 Contact with and (suspected) exposure to unspecified communicable disease: Secondary | ICD-10-CM | POA: Diagnosis not present

## 2016-07-09 DIAGNOSIS — L570 Actinic keratosis: Secondary | ICD-10-CM | POA: Diagnosis not present

## 2016-07-09 DIAGNOSIS — D225 Melanocytic nevi of trunk: Secondary | ICD-10-CM | POA: Diagnosis not present

## 2016-07-09 DIAGNOSIS — L814 Other melanin hyperpigmentation: Secondary | ICD-10-CM | POA: Diagnosis not present

## 2016-07-09 DIAGNOSIS — D2272 Melanocytic nevi of left lower limb, including hip: Secondary | ICD-10-CM | POA: Diagnosis not present

## 2016-07-09 DIAGNOSIS — D1801 Hemangioma of skin and subcutaneous tissue: Secondary | ICD-10-CM | POA: Diagnosis not present

## 2016-08-28 DIAGNOSIS — Z79899 Other long term (current) drug therapy: Secondary | ICD-10-CM | POA: Diagnosis not present

## 2016-08-28 DIAGNOSIS — M2550 Pain in unspecified joint: Secondary | ICD-10-CM | POA: Diagnosis not present

## 2016-08-28 DIAGNOSIS — M064 Inflammatory polyarthropathy: Secondary | ICD-10-CM | POA: Diagnosis not present

## 2016-12-07 ENCOUNTER — Telehealth: Payer: Self-pay | Admitting: Hematology and Oncology

## 2016-12-07 NOTE — Telephone Encounter (Signed)
Left message for patient regarding update to schedule. Appointment moved from 10/17 to 10/23.

## 2016-12-12 ENCOUNTER — Ambulatory Visit: Payer: BLUE CROSS/BLUE SHIELD | Admitting: Hematology and Oncology

## 2016-12-17 ENCOUNTER — Telehealth: Payer: Self-pay | Admitting: Hematology and Oncology

## 2016-12-17 DIAGNOSIS — H9190 Unspecified hearing loss, unspecified ear: Secondary | ICD-10-CM | POA: Diagnosis not present

## 2016-12-17 DIAGNOSIS — I10 Essential (primary) hypertension: Secondary | ICD-10-CM | POA: Diagnosis not present

## 2016-12-17 DIAGNOSIS — Z13220 Encounter for screening for lipoid disorders: Secondary | ICD-10-CM | POA: Diagnosis not present

## 2016-12-17 DIAGNOSIS — K625 Hemorrhage of anus and rectum: Secondary | ICD-10-CM | POA: Diagnosis not present

## 2016-12-17 DIAGNOSIS — Z23 Encounter for immunization: Secondary | ICD-10-CM | POA: Diagnosis not present

## 2016-12-17 NOTE — Telephone Encounter (Signed)
Spoke with patient and got appointments rescheduled per Dr.Gudena being out of the office.

## 2016-12-18 ENCOUNTER — Ambulatory Visit: Payer: BLUE CROSS/BLUE SHIELD | Admitting: Hematology and Oncology

## 2016-12-24 ENCOUNTER — Telehealth: Payer: Self-pay | Admitting: Hematology and Oncology

## 2016-12-24 ENCOUNTER — Ambulatory Visit (HOSPITAL_BASED_OUTPATIENT_CLINIC_OR_DEPARTMENT_OTHER): Payer: BLUE CROSS/BLUE SHIELD | Admitting: Hematology and Oncology

## 2016-12-24 DIAGNOSIS — N6092 Unspecified benign mammary dysplasia of left breast: Secondary | ICD-10-CM | POA: Diagnosis not present

## 2016-12-24 DIAGNOSIS — Z86000 Personal history of in-situ neoplasm of breast: Secondary | ICD-10-CM | POA: Diagnosis not present

## 2016-12-24 MED ORDER — LISINOPRIL 2.5 MG PO TABS: 2.5000 mg | ORAL_TABLET | Freq: Every day | ORAL | Status: DC

## 2016-12-24 MED ORDER — HYDROXYCHLOROQUINE SULFATE 200 MG PO TABS: 200.0000 mg | ORAL_TABLET | Freq: Two times a day (BID) | ORAL | Status: DC

## 2016-12-24 NOTE — Telephone Encounter (Signed)
Scheduled appt per 10/29 los - patient did not want an avs or calendar.

## 2016-12-24 NOTE — Assessment & Plan Note (Signed)
Lobular carcinoma In situ /fibrocystic disease/atypical lobular hyperplasia/PASH of the left breast status post lumpectomy in May 2015. Status post biopsy of the right breast suspicious nodule and was negative for any malignancy or precancerous lesions; currently on observation  Breast cancer surveillance: 1. Breast exam 12/24/2016 is normal 2. 3D mammogram 05/18/2016 is normal, breast density category C  Return to clinic in 1 year for follow-up

## 2016-12-24 NOTE — Progress Notes (Signed)
Patient Care Team: Gaynelle Arabian, MD as PCP - General (Family Medicine)  DIAGNOSIS:  Encounter Diagnosis  Name Primary?  . Atypical lobular hyperplasia of left breast     CHIEF COMPLIANT: Surveillance of atypical lobular hyperplasia  INTERVAL HISTORY: Wendy Waller is a 51 year old with above-mentioned history of left breast atypical lobular hyperplasia was currently on surveillance.  She reports no major problems or concerns.  She denies any lumps or nodules in the breast.  REVIEW OF SYSTEMS:   Constitutional: Denies fevers, chills or abnormal weight loss Eyes: Denies blurriness of vision Ears, nose, mouth, throat, and face: Denies mucositis or sore throat Respiratory: Denies cough, dyspnea or wheezes Cardiovascular: Denies palpitation, chest discomfort Gastrointestinal:  Denies nausea, heartburn or change in bowel habits Skin: Denies abnormal skin rashes Lymphatics: Denies new lymphadenopathy or easy bruising Neurological:Denies numbness, tingling or new weaknesses Behavioral/Psych: Mood is stable, no new changes  Extremities: No lower extremity edema Breast:  denies any pain or lumps or nodules in either breasts All other systems were reviewed with the patient and are negative.  I have reviewed the past medical history, past surgical history, social history and family history with the patient and they are unchanged from previous note.  ALLERGIES:  has No Known Allergies.  MEDICATIONS:  Current Outpatient Prescriptions  Medication Sig Dispense Refill  . cetirizine (ZYRTEC) 10 MG tablet Take 10 mg by mouth daily.    . diclofenac (VOLTAREN) 75 MG EC tablet     . diphenhydrAMINE (SOMINEX) 25 MG tablet Take 25 mg by mouth at bedtime as needed for sleep.     No current facility-administered medications for this visit.     PHYSICAL EXAMINATION: ECOG PERFORMANCE STATUS: 1 - Symptomatic but completely ambulatory  Vitals:   12/24/16 1518  BP: (!) 149/77  Pulse: 74    Resp: 18  Temp: 98.3 F (36.8 C)  SpO2: 100%   Filed Weights   12/24/16 1518  Weight: 127 lb 9.6 oz (57.9 kg)    GENERAL:alert, no distress and comfortable SKIN: skin color, texture, turgor are normal, no rashes or significant lesions EYES: normal, Conjunctiva are pink and non-injected, sclera clear OROPHARYNX:no exudate, no erythema and lips, buccal mucosa, and tongue normal  NECK: supple, thyroid normal size, non-tender, without nodularity LYMPH:  no palpable lymphadenopathy in the cervical, axillary or inguinal LUNGS: clear to auscultation and percussion with normal breathing effort HEART: regular rate & rhythm and no murmurs and no lower extremity edema ABDOMEN:abdomen soft, non-tender and normal bowel sounds MUSCULOSKELETAL:no cyanosis of digits and no clubbing  NEURO: alert & oriented x 3 with fluent speech, no focal motor/sensory deficits EXTREMITIES: No lower extremity edema BREAST: No palpable masses or nodules in either right or left breasts. No palpable axillary supraclavicular or infraclavicular adenopathy no breast tenderness or nipple discharge. (exam performed in the presence of a chaperone)  LABORATORY DATA:  I have reviewed the data as listed   Chemistry      Component Value Date/Time   NA 139 06/01/2014 1419   K 4.0 06/01/2014 1419   CL 97 04/30/2007 1338   CO2 26 06/01/2014 1419   BUN 14.7 06/01/2014 1419   CREATININE 0.8 06/01/2014 1419      Component Value Date/Time   CALCIUM 8.8 06/01/2014 1419   ALKPHOS 55 06/01/2014 1419   AST 17 06/01/2014 1419   ALT 15 06/01/2014 1419   BILITOT 0.26 06/01/2014 1419       Lab Results  Component Value Date  WBC 4.7 06/01/2014   HGB 13.0 06/01/2014   HCT 36.9 06/01/2014   MCV 94.1 06/01/2014   PLT 187 06/01/2014   NEUTROABS 2.9 06/01/2014    ASSESSMENT & PLAN:  Atypical lobular hyperplasia of left breast Lobular carcinoma In situ /fibrocystic disease/atypical lobular hyperplasia/PASH of the left  breast status post lumpectomy in May 2015. Status post biopsy of the right breast suspicious nodule and was negative for any malignancy or precancerous lesions; currently on observation  Breast cancer surveillance: 1. Breast exam 12/24/2016 is normal 2. 3D mammogram 05/18/2016 is normal, breast density category C   Return to clinic in 1 year for follow-up and after that she can be followed by her primary care physician or gynecologist with annual breast exams and mammograms.   I spent 25 minutes talking to the patient of which more than half was spent in counseling and coordination of care.  No orders of the defined types were placed in this encounter.  The patient has a good understanding of the overall plan. she agrees with it. she will call with any problems that may develop before the next visit here.   Rulon Eisenmenger, MD 12/24/16

## 2017-01-02 DIAGNOSIS — K625 Hemorrhage of anus and rectum: Secondary | ICD-10-CM | POA: Diagnosis not present

## 2017-01-02 DIAGNOSIS — K900 Celiac disease: Secondary | ICD-10-CM | POA: Diagnosis not present

## 2017-01-02 DIAGNOSIS — R197 Diarrhea, unspecified: Secondary | ICD-10-CM | POA: Diagnosis not present

## 2017-01-10 DIAGNOSIS — D126 Benign neoplasm of colon, unspecified: Secondary | ICD-10-CM | POA: Diagnosis not present

## 2017-01-10 DIAGNOSIS — R197 Diarrhea, unspecified: Secondary | ICD-10-CM | POA: Diagnosis not present

## 2017-01-10 DIAGNOSIS — K2930 Chronic superficial gastritis without bleeding: Secondary | ICD-10-CM | POA: Diagnosis not present

## 2017-01-10 DIAGNOSIS — K3189 Other diseases of stomach and duodenum: Secondary | ICD-10-CM | POA: Diagnosis not present

## 2017-01-16 DIAGNOSIS — K2930 Chronic superficial gastritis without bleeding: Secondary | ICD-10-CM | POA: Diagnosis not present

## 2017-01-16 DIAGNOSIS — D126 Benign neoplasm of colon, unspecified: Secondary | ICD-10-CM | POA: Diagnosis not present

## 2017-01-23 DIAGNOSIS — K580 Irritable bowel syndrome with diarrhea: Secondary | ICD-10-CM | POA: Diagnosis not present

## 2017-01-23 DIAGNOSIS — D126 Benign neoplasm of colon, unspecified: Secondary | ICD-10-CM | POA: Diagnosis not present

## 2017-01-23 DIAGNOSIS — K900 Celiac disease: Secondary | ICD-10-CM | POA: Diagnosis not present

## 2017-02-05 DIAGNOSIS — H524 Presbyopia: Secondary | ICD-10-CM | POA: Diagnosis not present

## 2017-02-05 DIAGNOSIS — Z79899 Other long term (current) drug therapy: Secondary | ICD-10-CM | POA: Diagnosis not present

## 2017-02-05 DIAGNOSIS — H2513 Age-related nuclear cataract, bilateral: Secondary | ICD-10-CM | POA: Diagnosis not present

## 2017-02-05 DIAGNOSIS — Z09 Encounter for follow-up examination after completed treatment for conditions other than malignant neoplasm: Secondary | ICD-10-CM | POA: Diagnosis not present

## 2017-02-14 DIAGNOSIS — Z79899 Other long term (current) drug therapy: Secondary | ICD-10-CM | POA: Diagnosis not present

## 2017-02-25 DIAGNOSIS — J069 Acute upper respiratory infection, unspecified: Secondary | ICD-10-CM | POA: Diagnosis not present

## 2017-04-25 DIAGNOSIS — K900 Celiac disease: Secondary | ICD-10-CM | POA: Diagnosis not present

## 2017-04-25 DIAGNOSIS — K580 Irritable bowel syndrome with diarrhea: Secondary | ICD-10-CM | POA: Diagnosis not present

## 2017-05-20 DIAGNOSIS — Z79899 Other long term (current) drug therapy: Secondary | ICD-10-CM | POA: Diagnosis not present

## 2017-05-20 DIAGNOSIS — M2550 Pain in unspecified joint: Secondary | ICD-10-CM | POA: Diagnosis not present

## 2017-05-20 DIAGNOSIS — M064 Inflammatory polyarthropathy: Secondary | ICD-10-CM | POA: Diagnosis not present

## 2017-06-17 ENCOUNTER — Other Ambulatory Visit: Payer: Self-pay | Admitting: Family Medicine

## 2017-06-17 DIAGNOSIS — Z23 Encounter for immunization: Secondary | ICD-10-CM | POA: Diagnosis not present

## 2017-06-17 DIAGNOSIS — Z1231 Encounter for screening mammogram for malignant neoplasm of breast: Secondary | ICD-10-CM

## 2017-06-17 DIAGNOSIS — I10 Essential (primary) hypertension: Secondary | ICD-10-CM | POA: Diagnosis not present

## 2017-06-28 ENCOUNTER — Encounter: Payer: Self-pay | Admitting: Radiology

## 2017-06-28 ENCOUNTER — Ambulatory Visit
Admission: RE | Admit: 2017-06-28 | Discharge: 2017-06-28 | Disposition: A | Discharge status: Home / Self Care | Payer: BLUE CROSS/BLUE SHIELD | Source: Ambulatory Visit | Attending: Family Medicine | Admitting: Family Medicine

## 2017-06-28 DIAGNOSIS — Z1231 Encounter for screening mammogram for malignant neoplasm of breast: Secondary | ICD-10-CM

## 2017-07-18 ENCOUNTER — Other Ambulatory Visit: Payer: Self-pay | Admitting: Nurse Practitioner

## 2017-07-18 ENCOUNTER — Other Ambulatory Visit (HOSPITAL_COMMUNITY)
Admission: RE | Admit: 2017-07-18 | Discharge: 2017-07-18 | Disposition: A | Discharge status: Home / Self Care | Payer: BLUE CROSS/BLUE SHIELD | Source: Ambulatory Visit | Attending: Nurse Practitioner | Admitting: Nurse Practitioner

## 2017-07-18 DIAGNOSIS — Z01419 Encounter for gynecological examination (general) (routine) without abnormal findings: Secondary | ICD-10-CM | POA: Diagnosis not present

## 2017-07-18 DIAGNOSIS — N920 Excessive and frequent menstruation with regular cycle: Secondary | ICD-10-CM | POA: Diagnosis not present

## 2017-07-18 DIAGNOSIS — D0502 Lobular carcinoma in situ of left breast: Secondary | ICD-10-CM | POA: Diagnosis not present

## 2017-07-24 LAB — CYTOLOGY - PAP
DIAGNOSIS: NEGATIVE
HPV: NOT DETECTED

## 2017-10-09 DIAGNOSIS — Z8719 Personal history of other diseases of the digestive system: Secondary | ICD-10-CM | POA: Diagnosis not present

## 2017-10-09 DIAGNOSIS — K580 Irritable bowel syndrome with diarrhea: Secondary | ICD-10-CM | POA: Diagnosis not present

## 2017-11-05 DIAGNOSIS — H18413 Arcus senilis, bilateral: Secondary | ICD-10-CM | POA: Diagnosis not present

## 2017-11-05 DIAGNOSIS — H02831 Dermatochalasis of right upper eyelid: Secondary | ICD-10-CM | POA: Diagnosis not present

## 2017-11-05 DIAGNOSIS — H2513 Age-related nuclear cataract, bilateral: Secondary | ICD-10-CM | POA: Diagnosis not present

## 2017-11-05 DIAGNOSIS — Z79899 Other long term (current) drug therapy: Secondary | ICD-10-CM | POA: Diagnosis not present

## 2017-11-18 DIAGNOSIS — M064 Inflammatory polyarthropathy: Secondary | ICD-10-CM | POA: Diagnosis not present

## 2017-11-18 DIAGNOSIS — Z79899 Other long term (current) drug therapy: Secondary | ICD-10-CM | POA: Diagnosis not present

## 2017-11-18 DIAGNOSIS — Z23 Encounter for immunization: Secondary | ICD-10-CM | POA: Diagnosis not present

## 2017-11-18 DIAGNOSIS — M2550 Pain in unspecified joint: Secondary | ICD-10-CM | POA: Diagnosis not present

## 2017-12-17 DIAGNOSIS — I10 Essential (primary) hypertension: Secondary | ICD-10-CM | POA: Diagnosis not present

## 2017-12-24 ENCOUNTER — Telehealth: Payer: Self-pay | Admitting: Hematology and Oncology

## 2017-12-24 ENCOUNTER — Inpatient Hospital Stay: Payer: BLUE CROSS/BLUE SHIELD | Attending: Hematology and Oncology | Admitting: Hematology and Oncology

## 2017-12-24 DIAGNOSIS — N6092 Unspecified benign mammary dysplasia of left breast: Secondary | ICD-10-CM | POA: Diagnosis not present

## 2017-12-24 DIAGNOSIS — D0502 Lobular carcinoma in situ of left breast: Secondary | ICD-10-CM

## 2017-12-24 DIAGNOSIS — Z86000 Personal history of in-situ neoplasm of breast: Secondary | ICD-10-CM

## 2017-12-24 NOTE — Assessment & Plan Note (Signed)
Lobular carcinoma In situ /fibrocystic disease/atypical lobular hyperplasia/PASH of the left breast status post lumpectomy in May 2015. Status post biopsy of the right breast suspicious nodule and was negative for any malignancy or precancerous lesions; currently on observation  Breast cancer surveillance: 1. Breast exam 10/29/2019is normal 2. 3D mammogram 07/01/17 is normal, breast density category C  she can be followed by her primary care physician or gynecologist with annual breast exams and mammograms.

## 2017-12-24 NOTE — Telephone Encounter (Signed)
Patient decline avs and calendar °

## 2017-12-24 NOTE — Progress Notes (Signed)
Patient Care Team: Gaynelle Arabian, MD as PCP - General (Family Medicine)  DIAGNOSIS:    ICD-10-CM   1. Lobular carcinoma in situ (LCIS) of left breast D05.02 MR BREAST BILATERAL W WO CONTRAST INC CAD  2. Atypical lobular hyperplasia of left breast N60.92     CHIEF COMPLIANT: Surveillance of atypical lobular hyperplasia  INTERVAL HISTORY: Wendy Waller is a 52 y.o. with above-mentioned history of left breast atypical lobular hyperplasia. She is currently on surveillance and she notes that she has been doing well overall.    REVIEW OF SYSTEMS:   Constitutional: Denies fevers, chills or abnormal weight loss Eyes: Denies blurriness of vision Ears, nose, mouth, throat, and face: Denies mucositis or sore throat Respiratory: Denies cough, dyspnea or wheezes Cardiovascular: Denies palpitation, chest discomfort Gastrointestinal:  Denies nausea, heartburn or change in bowel habits Skin: Denies abnormal skin rashes Lymphatics: Denies new lymphadenopathy or easy bruising Neurological:Denies numbness, tingling or new weaknesses Behavioral/Psych: Mood is stable, no new changes  Extremities: No lower extremity edema Breast: denies any pain or lumps or nodules in either breasts All other systems were reviewed with the patient and are negative.  I have reviewed the past medical history, past surgical history, social history and family history with the patient and they are unchanged from previous note.  ALLERGIES:  is allergic to sulfa antibiotics.  MEDICATIONS:  Current Outpatient Medications  Medication Sig Dispense Refill  . cetirizine (ZYRTEC) 10 MG tablet Take 10 mg by mouth daily.    . diclofenac (VOLTAREN) 75 MG EC tablet     . diphenhydrAMINE (SOMINEX) 25 MG tablet Take 25 mg by mouth at bedtime as needed for sleep.    . hydroxychloroquine (PLAQUENIL) 200 MG tablet Take 1 tablet (200 mg total) by mouth 2 (two) times daily.    Marland Kitchen lisinopril (ZESTRIL) 2.5 MG tablet Take 1 tablet (2.5  mg total) by mouth daily.     No current facility-administered medications for this visit.     PHYSICAL EXAMINATION: ECOG PERFORMANCE STATUS: 1 - Symptomatic but completely ambulatory  Vitals:   12/24/17 1523  BP: 117/70  Pulse: 63  Resp: 18  Temp: 98.6 F (37 C)  SpO2: 99%   Filed Weights   12/24/17 1523  Weight: 117 lb 3.2 oz (53.2 kg)    GENERAL:alert, no distress and comfortable SKIN: skin color, texture, turgor are normal, no rashes or significant lesions EYES: normal, Conjunctiva are pink and non-injected, sclera clear OROPHARYNX:no exudate, no erythema and lips, buccal mucosa, and tongue normal  NECK: supple, thyroid normal size, non-tender, without nodularity LYMPH:  no palpable lymphadenopathy in the cervical, axillary or inguinal LUNGS: clear to auscultation and percussion with normal breathing effort HEART: regular rate & rhythm and no murmurs and no lower extremity edema ABDOMEN:abdomen soft, non-tender and normal bowel sounds MUSCULOSKELETAL:no cyanosis of digits and no clubbing  NEURO: alert & oriented x 3 with fluent speech, no focal motor/sensory deficits EXTREMITIES: No lower extremity edema BREAST: No palpable masses or nodules in either right or left breasts. No palpable axillary supraclavicular or infraclavicular adenopathy no breast tenderness or nipple discharge. (exam performed in the presence of a chaperone)  LABORATORY DATA:  I have reviewed the data as listed CMP Latest Ref Rng & Units 06/01/2014 08/18/2013 04/30/2007  Glucose 70 - 140 mg/dl 87 118 95  BUN 7.0 - 26.0 mg/dL 14.7 17.1 13  Creatinine 0.6 - 1.1 mg/dL 0.8 0.8 0.58  Sodium 136 - 145 mEq/L 139 138 131(L)  Potassium 3.5 - 5.1 mEq/L 4.0 4.2 3.6  Chloride - - - 97  CO2 22 - 29 mEq/L 26 27 23   Calcium 8.4 - 10.4 mg/dL 8.8 9.5 9.4  Total Protein 6.4 - 8.3 g/dL 7.4 7.6 -  Total Bilirubin 0.20 - 1.20 mg/dL 0.26 0.44 -  Alkaline Phos 40 - 150 U/L 55 67 -  AST 5 - 34 U/L 17 17 -  ALT 0 - 55 U/L  15 15 -    Lab Results  Component Value Date   WBC 4.7 06/01/2014   HGB 13.0 06/01/2014   HCT 36.9 06/01/2014   MCV 94.1 06/01/2014   PLT 187 06/01/2014   NEUTROABS 2.9 06/01/2014    ASSESSMENT & PLAN:  Atypical lobular hyperplasia of left breast Lobular carcinoma In situ /fibrocystic disease/atypical lobular hyperplasia/PASH of the left breast status post lumpectomy in May 2015. Status post biopsy of the right breast suspicious nodule and was negative for any malignancy or precancerous lesions; currently on observation  Breast cancer surveillance: 1. Breast exam 10/29/2019is normal 2. 3D mammogram 07/01/17 is normal, breast density category C  Based on [place] for risk of breast cancer, patient has a 20% risk of breast cancer in 10 years and is 68% risk of breast cancer by the age of 79. Based on these numbers she benefits from annual breast MRIs or at least every other year breast MRIs. I will set her up for a breast MRI to be done within the next month.  I will call her with the results of this test. Return to clinic once a year for follow-ups.     Orders Placed This Encounter  Procedures  . MR BREAST BILATERAL W WO CONTRAST INC CAD    Standing Status:   Future    Standing Expiration Date:   02/24/2019    Order Specific Question:   ** REASON FOR EXAM (FREE TEXT)    Answer:   > 20% lifetime risk (68% by Omar Person)    Order Specific Question:   If indicated for the ordered procedure, I authorize the administration of contrast media per Radiology protocol    Answer:   Yes    Order Specific Question:   What is the patient's sedation requirement?    Answer:   No Sedation    Order Specific Question:   Does the patient have a pacemaker or implanted devices?    Answer:   No    Order Specific Question:   Radiology Contrast Protocol - do NOT remove file path    Answer:   [email or web address]    Order Specific Question:   Preferred imaging  location?    Answer:   [facility] (table limit-550lbs)   The patient has a good understanding of the overall plan. she agrees with it. she will call with any problems that may develop before the next visit here.  Nicholas Lose, MD 12/24/2017  I, Soijett Blue am acting as scribe for Dr. Nicholas Lose.  I have reviewed the above documentation for accuracy and completeness, and I agree with the above.

## 2018-01-20 ENCOUNTER — Ambulatory Visit
Admission: RE | Admit: 2018-01-20 | Discharge: 2018-01-20 | Disposition: A | Discharge status: Home / Self Care | Payer: BLUE CROSS/BLUE SHIELD | Source: Ambulatory Visit | Attending: Hematology and Oncology | Admitting: Hematology and Oncology

## 2018-01-20 DIAGNOSIS — D0502 Lobular carcinoma in situ of left breast: Secondary | ICD-10-CM

## 2018-01-20 MED ORDER — GADOBUTROL 1 MMOL/ML IV SOLN
5.0000 mL | Freq: Once | INTRAVENOUS | Status: AC | PRN
  Administered 2018-01-20: 5 mL via INTRAVENOUS

## 2018-01-21 ENCOUNTER — Telehealth: Payer: Self-pay | Admitting: Hematology and Oncology

## 2018-01-21 NOTE — Telephone Encounter (Signed)
I informed the patient that the breast MRI was normal. 

## 2018-05-01 DIAGNOSIS — J3489 Other specified disorders of nose and nasal sinuses: Secondary | ICD-10-CM | POA: Diagnosis not present

## 2018-06-19 DIAGNOSIS — M2550 Pain in unspecified joint: Secondary | ICD-10-CM | POA: Diagnosis not present

## 2018-06-19 DIAGNOSIS — M150 Primary generalized (osteo)arthritis: Secondary | ICD-10-CM | POA: Diagnosis not present

## 2018-06-19 DIAGNOSIS — Z79899 Other long term (current) drug therapy: Secondary | ICD-10-CM | POA: Diagnosis not present

## 2018-06-19 DIAGNOSIS — M064 Inflammatory polyarthropathy: Secondary | ICD-10-CM | POA: Diagnosis not present

## 2018-07-16 DIAGNOSIS — D2271 Melanocytic nevi of right lower limb, including hip: Secondary | ICD-10-CM | POA: Diagnosis not present

## 2018-07-16 DIAGNOSIS — L570 Actinic keratosis: Secondary | ICD-10-CM | POA: Diagnosis not present

## 2018-07-16 DIAGNOSIS — D225 Melanocytic nevi of trunk: Secondary | ICD-10-CM | POA: Diagnosis not present

## 2018-07-16 DIAGNOSIS — D1801 Hemangioma of skin and subcutaneous tissue: Secondary | ICD-10-CM | POA: Diagnosis not present

## 2018-07-16 DIAGNOSIS — L814 Other melanin hyperpigmentation: Secondary | ICD-10-CM | POA: Diagnosis not present

## 2018-07-17 DIAGNOSIS — M064 Inflammatory polyarthropathy: Secondary | ICD-10-CM | POA: Diagnosis not present

## 2018-08-18 DIAGNOSIS — I10 Essential (primary) hypertension: Secondary | ICD-10-CM | POA: Diagnosis not present

## 2018-08-19 ENCOUNTER — Other Ambulatory Visit: Payer: Self-pay | Admitting: Family Medicine

## 2018-08-19 DIAGNOSIS — Z1231 Encounter for screening mammogram for malignant neoplasm of breast: Secondary | ICD-10-CM

## 2018-08-28 ENCOUNTER — Other Ambulatory Visit: Payer: Self-pay

## 2018-08-28 ENCOUNTER — Ambulatory Visit
Admission: RE | Admit: 2018-08-28 | Discharge: 2018-08-28 | Disposition: A | Discharge status: Home / Self Care | Payer: BC Managed Care – PPO | Source: Ambulatory Visit | Attending: Family Medicine | Admitting: Family Medicine

## 2018-08-28 DIAGNOSIS — Z1231 Encounter for screening mammogram for malignant neoplasm of breast: Secondary | ICD-10-CM

## 2018-08-28 DIAGNOSIS — K580 Irritable bowel syndrome with diarrhea: Secondary | ICD-10-CM | POA: Diagnosis not present

## 2018-09-17 DIAGNOSIS — R768 Other specified abnormal immunological findings in serum: Secondary | ICD-10-CM | POA: Diagnosis not present

## 2018-12-22 DIAGNOSIS — M064 Inflammatory polyarthropathy: Secondary | ICD-10-CM | POA: Diagnosis not present

## 2018-12-22 DIAGNOSIS — Z23 Encounter for immunization: Secondary | ICD-10-CM | POA: Diagnosis not present

## 2018-12-22 DIAGNOSIS — M2550 Pain in unspecified joint: Secondary | ICD-10-CM | POA: Diagnosis not present

## 2018-12-22 DIAGNOSIS — Z79899 Other long term (current) drug therapy: Secondary | ICD-10-CM | POA: Diagnosis not present

## 2018-12-22 DIAGNOSIS — M150 Primary generalized (osteo)arthritis: Secondary | ICD-10-CM | POA: Diagnosis not present

## 2018-12-24 NOTE — Progress Notes (Addendum)
Patient Care Team: Gaynelle Arabian, MD as PCP - General (Family Medicine)  DIAGNOSIS:    ICD-10-CM   1. Atypical lobular hyperplasia of left breast  N60.92     CHIEF COMPLIANT: Surveillance of atypical lobular hyperplasia  INTERVAL HISTORY: Wendy Waller is a 53 y.o. with above-mentioned history of left breast atypical lobular hyperplasia. She is currently on surveillance. I last saw her a year ago. Breast MRI on 01/20/18 showed no evidence of malignancy bilaterally.  Mammogram on 08/28/18 showed no evidence of malignancy bilaterally. She presents to the clinic today for follow-up.   REVIEW OF SYSTEMS:   Constitutional: Denies fevers, chills or abnormal weight loss Eyes: Denies blurriness of vision Ears, nose, mouth, throat, and face: Denies mucositis or sore throat Respiratory: Denies cough, dyspnea or wheezes Cardiovascular: Denies palpitation, chest discomfort Gastrointestinal: Denies nausea, heartburn or change in bowel habits Skin: Denies abnormal skin rashes Lymphatics: Denies new lymphadenopathy or easy bruising Neurological: Denies numbness, tingling or new weaknesses Behavioral/Psych: Mood is stable, no new changes  Extremities: No lower extremity edema Breast: denies any pain or lumps or nodules in either breasts All other systems were reviewed with the patient and are negative.  I have reviewed the past medical history, past surgical history, social history and family history with the patient and they are unchanged from previous note.  ALLERGIES:  is allergic to sulfa antibiotics.  MEDICATIONS:  Current Outpatient Medications  Medication Sig Dispense Refill  . cetirizine (ZYRTEC) 10 MG tablet Take 10 mg by mouth daily.    . diclofenac (VOLTAREN) 75 MG EC tablet     . diphenhydrAMINE (SOMINEX) 25 MG tablet Take 25 mg by mouth at bedtime as needed for sleep.    . hydroxychloroquine (PLAQUENIL) 200 MG tablet Take 1 tablet (200 mg total) by mouth 2 (two) times daily.     Marland Kitchen lisinopril (ZESTRIL) 2.5 MG tablet Take 1 tablet (2.5 mg total) by mouth daily.     No current facility-administered medications for this visit.     PHYSICAL EXAMINATION: ECOG PERFORMANCE STATUS: 1 - Symptomatic but completely ambulatory  There were no vitals filed for this visit. There were no vitals filed for this visit.  GENERAL: alert, no distress and comfortable SKIN: skin color, texture, turgor are normal, no rashes or significant lesions EYES: normal, Conjunctiva are pink and non-injected, sclera clear OROPHARYNX: no exudate, no erythema and lips, buccal mucosa, and tongue normal  NECK: supple, thyroid normal size, non-tender, without nodularity LYMPH: no palpable lymphadenopathy in the cervical, axillary or inguinal LUNGS: clear to auscultation and percussion with normal breathing effort HEART: regular rate & rhythm and no murmurs and no lower extremity edema ABDOMEN: abdomen soft, non-tender and normal bowel sounds MUSCULOSKELETAL: no cyanosis of digits and no clubbing  NEURO: alert & oriented x 3 with fluent speech, no focal motor/sensory deficits EXTREMITIES: No lower extremity edema BREAST: No palpable masses or nodules in either right or left breasts. No palpable axillary supraclavicular or infraclavicular adenopathy no breast tenderness or nipple discharge. (exam performed in the presence of a chaperone)  LABORATORY DATA:  I have reviewed the data as listed CMP Latest Ref Rng & Units 06/01/2014 08/18/2013 04/30/2007  Glucose 70 - 140 mg/dl 87 118 95  BUN 7.0 - 26.0 mg/dL 14.7 17.1 13  Creatinine 0.6 - 1.1 mg/dL 0.8 0.8 0.58  Sodium 136 - 145 mEq/L 139 138 131(L)  Potassium 3.5 - 5.1 mEq/L 4.0 4.2 3.6  Chloride - - - 97  CO2  22 - 29 mEq/L 26 27 23   Calcium 8.4 - 10.4 mg/dL 8.8 9.5 9.4  Total Protein 6.4 - 8.3 g/dL 7.4 7.6 -  Total Bilirubin 0.20 - 1.20 mg/dL 0.26 0.44 -  Alkaline Phos 40 - 150 U/L 55 67 -  AST 5 - 34 U/L 17 17 -  ALT 0 - 55 U/L 15 15 -    Lab  Results  Component Value Date   WBC 4.7 06/01/2014   HGB 13.0 06/01/2014   HCT 36.9 06/01/2014   MCV 94.1 06/01/2014   PLT 187 06/01/2014   NEUTROABS 2.9 06/01/2014    ASSESSMENT & PLAN:  Atypical lobular hyperplasia of left breast Lobular carcinoma In situ /fibrocystic disease/atypical lobular hyperplasia/PASH of the left breast status post lumpectomy in May 2015. Status post biopsy of the right breast suspicious nodule and was negative for any malignancy or precancerous lesions; currently on observation  Breast cancer surveillance: 1. Breast exam10/29/2020: No evidence of malignancy. 2. 3D mammogram7/05/2018: Benign, breast density category D 3.  Breast MRI 01/20/2018: No evidence of malignancy, breast density category C  Based on [place] for risk of breast cancer, patient has a 20% risk of breast cancer in 10 years and is 68% risk of breast cancer by the age of 2.  We once again discussed the pros and cons of antiestrogen therapy with tamoxifen. Tamoxifen counseling: We discussed the risks and benefits of tamoxifen. These include but not limited to insomnia, hot flashes, mood changes, vaginal dryness, and weight gain. Although rare, serious side effects including endometrial cancer, risk of blood clots were also discussed. We strongly believe that the benefits far outweigh the risks. Patient understands these risks and consented to starting treatment. Planned treatment duration is 5 years.  Patient is willing to take tamoxifen.  I sent a prescription for her.  She will start at 10 mg and then increase to 20 mg if she tolerates it well. We will obtain a breast MRI in December 2020. Next year we may skip breast MRI and do it every 2 years.  Return to clinic in 1 year for follow-up   No orders of the defined types were placed in this encounter.  The patient has a good understanding of the overall plan. she agrees with it. she will call with any problems that may develop  before the next visit here.  Nicholas Lose, MD 12/25/2018  Julious Oka Dorshimer am acting as scribe for Dr. Nicholas Lose.  I have reviewed the above documentation for accuracy and completeness, and I agree with the above.

## 2018-12-25 ENCOUNTER — Other Ambulatory Visit: Payer: Self-pay

## 2018-12-25 ENCOUNTER — Inpatient Hospital Stay: Payer: BC Managed Care – PPO | Attending: Hematology and Oncology | Admitting: Hematology and Oncology

## 2018-12-25 DIAGNOSIS — Z1231 Encounter for screening mammogram for malignant neoplasm of breast: Secondary | ICD-10-CM

## 2018-12-25 DIAGNOSIS — N6092 Unspecified benign mammary dysplasia of left breast: Secondary | ICD-10-CM | POA: Diagnosis not present

## 2018-12-25 MED ORDER — TAMOXIFEN CITRATE 20 MG PO TABS: 20.0000 mg | ORAL_TABLET | Freq: Every day | ORAL | 3 refills | Status: DC

## 2018-12-25 NOTE — Assessment & Plan Note (Signed)
Lobular carcinoma In situ /fibrocystic disease/atypical lobular hyperplasia/PASH of the left breast status post lumpectomy in May 2015. Status post biopsy of the right breast suspicious nodule and was negative for any malignancy or precancerous lesions; currently on observation  Breast cancer surveillance: 1. Breast exam10/29/2020: No evidence of malignancy. 2. 3D mammogram7/05/2018: Benign, breast density category D 3.  Breast MRI 01/20/2018: No evidence of malignancy, breast density category C  Based on [place] for risk of breast cancer, patient has a 20% risk of breast cancer in 10 years and is 68% risk of breast cancer by the age of 3.  We once again discussed the pros and cons of antiestrogen therapy with tamoxifen. Return to clinic in 1 year for follow-up

## 2018-12-26 ENCOUNTER — Telehealth: Payer: Self-pay | Admitting: Hematology and Oncology

## 2018-12-26 NOTE — Telephone Encounter (Signed)
I talk with patient regarding schedule  

## 2019-01-19 DIAGNOSIS — L570 Actinic keratosis: Secondary | ICD-10-CM | POA: Diagnosis not present

## 2019-01-19 DIAGNOSIS — L578 Other skin changes due to chronic exposure to nonionizing radiation: Secondary | ICD-10-CM | POA: Diagnosis not present

## 2019-01-19 DIAGNOSIS — L814 Other melanin hyperpigmentation: Secondary | ICD-10-CM | POA: Diagnosis not present

## 2019-01-20 DIAGNOSIS — Z6822 Body mass index (BMI) 22.0-22.9, adult: Secondary | ICD-10-CM | POA: Diagnosis not present

## 2019-01-20 DIAGNOSIS — Z20828 Contact with and (suspected) exposure to other viral communicable diseases: Secondary | ICD-10-CM | POA: Diagnosis not present

## 2019-01-30 DIAGNOSIS — H903 Sensorineural hearing loss, bilateral: Secondary | ICD-10-CM | POA: Diagnosis not present

## 2019-02-10 ENCOUNTER — Other Ambulatory Visit: Payer: Self-pay

## 2019-02-10 ENCOUNTER — Ambulatory Visit
Admission: RE | Admit: 2019-02-10 | Discharge: 2019-02-10 | Disposition: A | Discharge status: Home / Self Care | Payer: BC Managed Care – PPO | Source: Ambulatory Visit | Attending: Hematology and Oncology | Admitting: Hematology and Oncology

## 2019-02-10 DIAGNOSIS — Z1231 Encounter for screening mammogram for malignant neoplasm of breast: Secondary | ICD-10-CM

## 2019-02-10 DIAGNOSIS — Z853 Personal history of malignant neoplasm of breast: Secondary | ICD-10-CM | POA: Diagnosis not present

## 2019-02-10 MED ORDER — GADOBUTROL 1 MMOL/ML IV SOLN
5.0000 mL | Freq: Once | INTRAVENOUS | Status: AC | PRN
  Administered 2019-02-10: 15:00:00 5 mL via INTRAVENOUS

## 2019-02-11 DIAGNOSIS — Z20828 Contact with and (suspected) exposure to other viral communicable diseases: Secondary | ICD-10-CM | POA: Diagnosis not present

## 2019-02-12 ENCOUNTER — Telehealth: Payer: Self-pay | Admitting: Hematology and Oncology

## 2019-02-12 NOTE — Telephone Encounter (Signed)
I informed the patient that her breast MRI is normal. She wants to increase the dose of tamoxifen to 20 mg daily.

## 2019-02-16 DIAGNOSIS — H903 Sensorineural hearing loss, bilateral: Secondary | ICD-10-CM | POA: Diagnosis not present

## 2019-02-17 DIAGNOSIS — I10 Essential (primary) hypertension: Secondary | ICD-10-CM | POA: Diagnosis not present

## 2019-02-17 DIAGNOSIS — J3489 Other specified disorders of nose and nasal sinuses: Secondary | ICD-10-CM | POA: Diagnosis not present

## 2019-04-25 ENCOUNTER — Ambulatory Visit: Payer: BC Managed Care – PPO | Attending: Internal Medicine

## 2019-04-25 DIAGNOSIS — Z23 Encounter for immunization: Secondary | ICD-10-CM | POA: Insufficient documentation

## 2019-04-25 NOTE — Progress Notes (Signed)
   Covid-19 Vaccination Clinic  Name:  Wendy Waller    MRN: [id number] DOB: 12/30/1965  04/25/2019  Wendy Waller was observed post Covid-19 immunization for 15 minutes without incidence. She was provided with Vaccine Information Sheet and instruction to access the V-Safe system.   Wendy Waller was instructed to call 911 with any severe reactions post vaccine: Marland Kitchen Difficulty breathing  . Swelling of your face and throat  . A fast heartbeat  . A bad rash all over your body  . Dizziness and weakness    Immunizations Administered    Name Date Dose VIS Date Route   Pfizer COVID-19 Vaccine 04/25/2019 10:31 AM 0.3 mL 02/06/2019 Intramuscular   Manufacturer: [place]   Lot: [id number]   [place]: [id number]

## 2019-05-16 ENCOUNTER — Ambulatory Visit: Payer: BC Managed Care – PPO

## 2019-05-19 ENCOUNTER — Ambulatory Visit: Payer: BC Managed Care – PPO | Attending: Internal Medicine

## 2019-05-19 DIAGNOSIS — Z23 Encounter for immunization: Secondary | ICD-10-CM

## 2019-05-19 NOTE — Progress Notes (Signed)
   Covid-19 Vaccination Clinic  Name:  Wendy Waller    MRN: [id number] DOB: Jul 07, 1965  05/19/2019  [name] was observed post Covid-19 immunization for 15 minutes without incident. She was provided with Vaccine Information Sheet and instruction to access the V-Safe system.   [name] was instructed to call 911 with any severe reactions post vaccine: Marland Kitchen Difficulty breathing  . Swelling of face and throat  . A fast heartbeat  . A bad rash all over body  . Dizziness and weakness   Immunizations Administered    Name Date Dose VIS Date Route   Pfizer COVID-19 Vaccine 05/19/2019  1:54 PM 0.3 mL 02/06/2019 Intramuscular   Manufacturer: [place]   Lot: [id number]   [place]: [id number]

## 2019-05-20 ENCOUNTER — Ambulatory Visit: Payer: BC Managed Care – PPO

## 2019-07-21 ENCOUNTER — Other Ambulatory Visit: Payer: Self-pay | Admitting: Family Medicine

## 2019-07-21 DIAGNOSIS — Z1231 Encounter for screening mammogram for malignant neoplasm of breast: Secondary | ICD-10-CM

## 2019-07-21 DIAGNOSIS — M2550 Pain in unspecified joint: Secondary | ICD-10-CM | POA: Diagnosis not present

## 2019-07-21 DIAGNOSIS — M064 Inflammatory polyarthropathy: Secondary | ICD-10-CM | POA: Diagnosis not present

## 2019-07-21 DIAGNOSIS — M150 Primary generalized (osteo)arthritis: Secondary | ICD-10-CM | POA: Diagnosis not present

## 2019-07-21 DIAGNOSIS — Z79899 Other long term (current) drug therapy: Secondary | ICD-10-CM | POA: Diagnosis not present

## 2019-08-18 DIAGNOSIS — I10 Essential (primary) hypertension: Secondary | ICD-10-CM | POA: Diagnosis not present

## 2019-08-18 DIAGNOSIS — D649 Anemia, unspecified: Secondary | ICD-10-CM | POA: Diagnosis not present

## 2019-09-01 ENCOUNTER — Other Ambulatory Visit: Payer: Self-pay

## 2019-09-01 ENCOUNTER — Ambulatory Visit
Admission: RE | Admit: 2019-09-01 | Discharge: 2019-09-01 | Disposition: A | Discharge status: Home / Self Care | Payer: BC Managed Care – PPO | Source: Ambulatory Visit | Attending: Family Medicine | Admitting: Family Medicine

## 2019-09-01 DIAGNOSIS — Z1231 Encounter for screening mammogram for malignant neoplasm of breast: Secondary | ICD-10-CM

## 2019-09-04 ENCOUNTER — Other Ambulatory Visit: Payer: Self-pay | Admitting: Family Medicine

## 2019-09-04 DIAGNOSIS — R928 Other abnormal and inconclusive findings on diagnostic imaging of breast: Secondary | ICD-10-CM

## 2019-09-07 ENCOUNTER — Ambulatory Visit
Admission: RE | Admit: 2019-09-07 | Discharge: 2019-09-07 | Disposition: A | Discharge status: Home / Self Care | Payer: BC Managed Care – PPO | Source: Ambulatory Visit | Attending: Family Medicine | Admitting: Family Medicine

## 2019-09-07 ENCOUNTER — Other Ambulatory Visit: Payer: Self-pay

## 2019-09-07 DIAGNOSIS — N6011 Diffuse cystic mastopathy of right breast: Secondary | ICD-10-CM | POA: Diagnosis not present

## 2019-09-07 DIAGNOSIS — R922 Inconclusive mammogram: Secondary | ICD-10-CM | POA: Diagnosis not present

## 2019-09-07 DIAGNOSIS — R928 Other abnormal and inconclusive findings on diagnostic imaging of breast: Secondary | ICD-10-CM

## 2019-09-15 DIAGNOSIS — D2271 Melanocytic nevi of right lower limb, including hip: Secondary | ICD-10-CM | POA: Diagnosis not present

## 2019-09-15 DIAGNOSIS — L814 Other melanin hyperpigmentation: Secondary | ICD-10-CM | POA: Diagnosis not present

## 2019-09-15 DIAGNOSIS — L570 Actinic keratosis: Secondary | ICD-10-CM | POA: Diagnosis not present

## 2019-09-15 DIAGNOSIS — L821 Other seborrheic keratosis: Secondary | ICD-10-CM | POA: Diagnosis not present

## 2019-09-15 DIAGNOSIS — D225 Melanocytic nevi of trunk: Secondary | ICD-10-CM | POA: Diagnosis not present

## 2019-09-16 ENCOUNTER — Other Ambulatory Visit: Payer: BC Managed Care – PPO

## 2019-11-21 DIAGNOSIS — Z6821 Body mass index (BMI) 21.0-21.9, adult: Secondary | ICD-10-CM | POA: Diagnosis not present

## 2019-11-21 DIAGNOSIS — Z20822 Contact with and (suspected) exposure to covid-19: Secondary | ICD-10-CM | POA: Diagnosis not present

## 2019-12-17 DIAGNOSIS — M25561 Pain in right knee: Secondary | ICD-10-CM | POA: Diagnosis not present

## 2019-12-25 ENCOUNTER — Ambulatory Visit: Payer: BC Managed Care – PPO | Admitting: Hematology and Oncology

## 2019-12-28 ENCOUNTER — Inpatient Hospital Stay: Payer: BC Managed Care – PPO | Attending: Hematology and Oncology | Admitting: Hematology and Oncology

## 2019-12-28 ENCOUNTER — Telehealth: Payer: Self-pay | Admitting: Hematology and Oncology

## 2019-12-28 ENCOUNTER — Other Ambulatory Visit: Payer: Self-pay

## 2019-12-28 DIAGNOSIS — M25561 Pain in right knee: Secondary | ICD-10-CM | POA: Diagnosis not present

## 2019-12-28 DIAGNOSIS — N6092 Unspecified benign mammary dysplasia of left breast: Secondary | ICD-10-CM | POA: Insufficient documentation

## 2019-12-28 DIAGNOSIS — Z79810 Long term (current) use of selective estrogen receptor modulators (SERMs): Secondary | ICD-10-CM | POA: Diagnosis not present

## 2019-12-28 DIAGNOSIS — N951 Menopausal and female climacteric states: Secondary | ICD-10-CM | POA: Insufficient documentation

## 2019-12-28 NOTE — Telephone Encounter (Signed)
Scheduled appointment per 1/11 los. Spoke to patient who is aware of appointment date and time.  °

## 2019-12-28 NOTE — Progress Notes (Signed)
Patient Care Team: Gaynelle Arabian, MD as PCP - General (Family Medicine)  DIAGNOSIS:    ICD-10-CM   1. Atypical lobular hyperplasia of left breast  N60.92     CHIEF COMPLIANT: Surveillance of atypical lobular hyperplasia  INTERVAL HISTORY: Wendy Waller is a 54 y.o. with above-mentioned history of left breast atypical lobular hyperplasia currently on tamoxifen. Mammogram on 09/01/19 showed possible right breast masses. Diagnostic mammogram and [place] on 09/07/19 showed 2 cysts and no evidence of malignancy. She presents to the clinic today for follow-up.  She continues to have hot flashes as result of tamoxifen at 10 mg dosage.  She denies any lumps or nodules in the breast.  She is traveling quite a bit to [place] to take care of her father's estate.   ALLERGIES:  is allergic to sulfa antibiotics.  MEDICATIONS:  Current Outpatient Medications  Medication Sig Dispense Refill  . cetirizine (ZYRTEC) 10 MG tablet Take 10 mg by mouth daily.    . diclofenac (VOLTAREN) 75 MG EC tablet     . diphenhydrAMINE (SOMINEX) 25 MG tablet Take 25 mg by mouth at bedtime as needed for sleep.    . hydroxychloroquine (PLAQUENIL) 200 MG tablet Take 1 tablet (200 mg total) by mouth 2 (two) times daily.    Marland Kitchen lisinopril (ZESTRIL) 2.5 MG tablet Take 1 tablet (2.5 mg total) by mouth daily.    . tamoxifen (NOLVADEX) 20 MG tablet Take 1 tablet (20 mg total) by mouth daily. 90 tablet 3   No current facility-administered medications for this visit.    PHYSICAL EXAMINATION: ECOG PERFORMANCE STATUS: 1 - Symptomatic but completely ambulatory  Vitals:   12/28/19 1415  BP: 134/84  Pulse: 75  Resp: 18  Temp: 99 F (37.2 C)  SpO2: 100%   Filed Weights   12/28/19 1415  Weight: 117 lb 9.6 oz (53.3 kg)    BREAST: No palpable masses or nodules in either right or left breasts. No palpable axillary supraclavicular or infraclavicular adenopathy no breast tenderness or nipple discharge. (exam  performed in the presence of a chaperone)  LABORATORY DATA:  I have reviewed the data as listed CMP Latest Ref Rng & Units 06/01/2014 08/18/2013 04/30/2007  Glucose 70 - 140 mg/dl 87 118 95  BUN 7.0 - 26.0 mg/dL 14.7 17.1 13  Creatinine 0.6 - 1.1 mg/dL 0.8 0.8 0.58  Sodium 136 - 145 mEq/L 139 138 131(L)  Potassium 3.5 - 5.1 mEq/L 4.0 4.2 3.6  Chloride - - - 97  CO2 22 - 29 mEq/L 26 27 23   Calcium 8.4 - 10.4 mg/dL 8.8 9.5 9.4  Total Protein 6.4 - 8.3 g/dL 7.4 7.6 -  Total Bilirubin 0.20 - 1.20 mg/dL 0.26 0.44 -  Alkaline Phos 40 - 150 U/L 55 67 -  AST 5 - 34 U/L 17 17 -  ALT 0 - 55 U/L 15 15 -    Lab Results  Component Value Date   WBC 4.7 06/01/2014   HGB 13.0 06/01/2014   HCT 36.9 06/01/2014   MCV 94.1 06/01/2014   PLT 187 06/01/2014   NEUTROABS 2.9 06/01/2014    ASSESSMENT & PLAN:  Atypical lobular hyperplasia of left breast Lobular carcinoma In situ /fibrocystic disease/atypical lobular hyperplasia/PASH of the left breast status post lumpectomy in May 2015. Status post biopsy of the right breast suspicious nodule and was negative for any malignancy or precancerous lesions; currently on observation  Breast cancer surveillance: 1. Breast exam10/29/2020: No evidence of malignancy. 2.  3D mammogram7/05/2018: Benign, breast density category D 3.  Breast MRI 01/20/2018: No evidence of malignancy, breast density category C With no plan to do MRIs of the breast every other year.  Based on [place] for risk of breast cancer, patient has a 20% risk of breast cancer in 10 years and is 68% risk of breast cancer by the age of 6.  Tamoxifen Toxicities: Taking 10 mg daily Moderate to severe hot flashes: Patient could not take the 20 mg dose.  She went back down to 10 mg and still has hot flashes which are quite profound.  Her menstrual cycles have stopped and she is very glad about that.  Breast Cancer Surveillance:  1. 09/03/2019: 2 masses Right breast corresponding to [facility]  change 2. Breast Exam: 12/28/19: Benign 3. MRI: 02/11/2019: Benign  RTC in 1 year     No orders of the defined types were placed in this encounter.  The patient has a good understanding of the overall plan. she agrees with it. she will call with any problems that may develop before the next visit here.  Total time spent: 20 mins including face to face time and time spent for planning, charting and coordination of care  Nicholas Lose, MD 12/28/2019  I, Cloyde Reams Dorshimer, am acting as scribe for Dr. Nicholas Lose.  I have reviewed the above documentation for accuracy and completeness, and I agree with the above.

## 2019-12-28 NOTE — Assessment & Plan Note (Signed)
Lobular carcinoma In situ /fibrocystic disease/atypical lobular hyperplasia/PASH of the left breast status post lumpectomy in May 2015. Status post biopsy of the right breast suspicious nodule and was negative for any malignancy or precancerous lesions; currently on observation  Breast cancer surveillance: 1. Breast exam10/29/2020: No evidence of malignancy. 2. 3D mammogram7/05/2018: Benign, breast density category D 3.  Breast MRI 01/20/2018: No evidence of malignancy, breast density category C  Based on [place] for risk of breast cancer, patient has a 20% risk of breast cancer in 10 years and is 68% risk of breast cancer by the age of 75.  Tamoxifen Toxicities: Tolerating it well at 20 mg daily Breast Cancer Surveillance:  1. 09/03/2019: 2 masses Right breast corresponding to [facility] change 2. Breast Exam: 12/28/19: Benign 3. MRI: 02/11/2019: Benign  RTC in 1 year

## 2019-12-31 DIAGNOSIS — M1712 Unilateral primary osteoarthritis, left knee: Secondary | ICD-10-CM | POA: Diagnosis not present

## 2019-12-31 DIAGNOSIS — M1711 Unilateral primary osteoarthritis, right knee: Secondary | ICD-10-CM | POA: Diagnosis not present

## 2020-01-15 ENCOUNTER — Other Ambulatory Visit: Payer: Self-pay | Admitting: Hematology and Oncology

## 2020-01-26 DIAGNOSIS — Z79899 Other long term (current) drug therapy: Secondary | ICD-10-CM | POA: Diagnosis not present

## 2020-01-26 DIAGNOSIS — M150 Primary generalized (osteo)arthritis: Secondary | ICD-10-CM | POA: Diagnosis not present

## 2020-01-26 DIAGNOSIS — M2550 Pain in unspecified joint: Secondary | ICD-10-CM | POA: Diagnosis not present

## 2020-01-26 DIAGNOSIS — M064 Inflammatory polyarthropathy: Secondary | ICD-10-CM | POA: Diagnosis not present

## 2020-01-26 DIAGNOSIS — Z23 Encounter for immunization: Secondary | ICD-10-CM | POA: Diagnosis not present

## 2020-02-16 DIAGNOSIS — H903 Sensorineural hearing loss, bilateral: Secondary | ICD-10-CM | POA: Diagnosis not present

## 2020-02-24 DIAGNOSIS — G8918 Other acute postprocedural pain: Secondary | ICD-10-CM | POA: Diagnosis not present

## 2020-02-24 DIAGNOSIS — S83271A Complex tear of lateral meniscus, current injury, right knee, initial encounter: Secondary | ICD-10-CM | POA: Diagnosis not present

## 2020-02-24 DIAGNOSIS — M94261 Chondromalacia, right knee: Secondary | ICD-10-CM | POA: Diagnosis not present

## 2020-02-24 DIAGNOSIS — Y999 Unspecified external cause status: Secondary | ICD-10-CM | POA: Diagnosis not present

## 2020-02-24 DIAGNOSIS — X58XXXA Exposure to other specified factors, initial encounter: Secondary | ICD-10-CM | POA: Diagnosis not present

## 2020-02-24 DIAGNOSIS — M2341 Loose body in knee, right knee: Secondary | ICD-10-CM | POA: Diagnosis not present

## 2020-02-27 DIAGNOSIS — Z8616 Personal history of COVID-19: Secondary | ICD-10-CM

## 2020-02-27 DIAGNOSIS — N939 Abnormal uterine and vaginal bleeding, unspecified: Secondary | ICD-10-CM

## 2020-02-27 HISTORY — DX: Personal history of COVID-19: Z86.16

## 2020-02-27 HISTORY — DX: Abnormal uterine and vaginal bleeding, unspecified: N93.9

## 2020-03-05 DIAGNOSIS — U071 COVID-19: Secondary | ICD-10-CM | POA: Diagnosis not present

## 2020-03-31 DIAGNOSIS — M25562 Pain in left knee: Secondary | ICD-10-CM | POA: Diagnosis not present

## 2020-03-31 DIAGNOSIS — M1712 Unilateral primary osteoarthritis, left knee: Secondary | ICD-10-CM | POA: Diagnosis not present

## 2020-03-31 DIAGNOSIS — M25561 Pain in right knee: Secondary | ICD-10-CM | POA: Diagnosis not present

## 2020-03-31 DIAGNOSIS — M1711 Unilateral primary osteoarthritis, right knee: Secondary | ICD-10-CM | POA: Diagnosis not present

## 2020-06-07 DIAGNOSIS — M1711 Unilateral primary osteoarthritis, right knee: Secondary | ICD-10-CM | POA: Diagnosis not present

## 2020-06-07 DIAGNOSIS — M1712 Unilateral primary osteoarthritis, left knee: Secondary | ICD-10-CM | POA: Diagnosis not present

## 2020-06-14 DIAGNOSIS — M25562 Pain in left knee: Secondary | ICD-10-CM | POA: Diagnosis not present

## 2020-06-14 DIAGNOSIS — M25561 Pain in right knee: Secondary | ICD-10-CM | POA: Diagnosis not present

## 2020-06-22 DIAGNOSIS — M0609 Rheumatoid arthritis without rheumatoid factor, multiple sites: Secondary | ICD-10-CM | POA: Diagnosis not present

## 2020-06-22 DIAGNOSIS — M150 Primary generalized (osteo)arthritis: Secondary | ICD-10-CM | POA: Diagnosis not present

## 2020-06-22 DIAGNOSIS — M2550 Pain in unspecified joint: Secondary | ICD-10-CM | POA: Diagnosis not present

## 2020-06-22 DIAGNOSIS — Z79899 Other long term (current) drug therapy: Secondary | ICD-10-CM | POA: Diagnosis not present

## 2020-06-23 DIAGNOSIS — M25561 Pain in right knee: Secondary | ICD-10-CM | POA: Diagnosis not present

## 2020-06-23 DIAGNOSIS — M1711 Unilateral primary osteoarthritis, right knee: Secondary | ICD-10-CM | POA: Diagnosis not present

## 2020-06-23 DIAGNOSIS — M1712 Unilateral primary osteoarthritis, left knee: Secondary | ICD-10-CM | POA: Diagnosis not present

## 2020-06-23 DIAGNOSIS — M25562 Pain in left knee: Secondary | ICD-10-CM | POA: Diagnosis not present

## 2020-07-14 DIAGNOSIS — R350 Frequency of micturition: Secondary | ICD-10-CM | POA: Diagnosis not present

## 2020-07-19 DIAGNOSIS — M170 Bilateral primary osteoarthritis of knee: Secondary | ICD-10-CM | POA: Diagnosis not present

## 2020-07-19 DIAGNOSIS — M25561 Pain in right knee: Secondary | ICD-10-CM | POA: Diagnosis not present

## 2020-07-19 DIAGNOSIS — M25562 Pain in left knee: Secondary | ICD-10-CM | POA: Diagnosis not present

## 2020-08-10 DIAGNOSIS — I10 Essential (primary) hypertension: Secondary | ICD-10-CM | POA: Diagnosis not present

## 2020-08-10 DIAGNOSIS — Z13220 Encounter for screening for lipoid disorders: Secondary | ICD-10-CM | POA: Diagnosis not present

## 2020-08-16 ENCOUNTER — Other Ambulatory Visit: Payer: Self-pay | Admitting: Family Medicine

## 2020-08-16 DIAGNOSIS — Z0000 Encounter for general adult medical examination without abnormal findings: Secondary | ICD-10-CM

## 2020-08-16 DIAGNOSIS — M1712 Unilateral primary osteoarthritis, left knee: Secondary | ICD-10-CM | POA: Diagnosis not present

## 2020-08-16 DIAGNOSIS — Z23 Encounter for immunization: Secondary | ICD-10-CM | POA: Diagnosis not present

## 2020-08-16 DIAGNOSIS — M1711 Unilateral primary osteoarthritis, right knee: Secondary | ICD-10-CM | POA: Diagnosis not present

## 2020-08-16 DIAGNOSIS — M79671 Pain in right foot: Secondary | ICD-10-CM | POA: Diagnosis not present

## 2020-08-18 DIAGNOSIS — Z01812 Encounter for preprocedural laboratory examination: Secondary | ICD-10-CM | POA: Diagnosis not present

## 2020-08-22 DIAGNOSIS — L218 Other seborrheic dermatitis: Secondary | ICD-10-CM | POA: Diagnosis not present

## 2020-08-22 DIAGNOSIS — D225 Melanocytic nevi of trunk: Secondary | ICD-10-CM | POA: Diagnosis not present

## 2020-08-22 DIAGNOSIS — D2271 Melanocytic nevi of right lower limb, including hip: Secondary | ICD-10-CM | POA: Diagnosis not present

## 2020-08-22 DIAGNOSIS — Z01810 Encounter for preprocedural cardiovascular examination: Secondary | ICD-10-CM | POA: Diagnosis not present

## 2020-08-22 DIAGNOSIS — L570 Actinic keratosis: Secondary | ICD-10-CM | POA: Diagnosis not present

## 2020-08-22 DIAGNOSIS — D1801 Hemangioma of skin and subcutaneous tissue: Secondary | ICD-10-CM | POA: Diagnosis not present

## 2020-08-26 DIAGNOSIS — M1711 Unilateral primary osteoarthritis, right knee: Secondary | ICD-10-CM | POA: Diagnosis not present

## 2020-08-31 DIAGNOSIS — M1711 Unilateral primary osteoarthritis, right knee: Secondary | ICD-10-CM | POA: Diagnosis not present

## 2020-09-19 DIAGNOSIS — Z01812 Encounter for preprocedural laboratory examination: Secondary | ICD-10-CM | POA: Diagnosis not present

## 2020-10-06 DIAGNOSIS — M1712 Unilateral primary osteoarthritis, left knee: Secondary | ICD-10-CM | POA: Diagnosis not present

## 2020-10-07 ENCOUNTER — Ambulatory Visit: Payer: BC Managed Care – PPO

## 2020-10-12 DIAGNOSIS — M1712 Unilateral primary osteoarthritis, left knee: Secondary | ICD-10-CM | POA: Diagnosis not present

## 2020-11-16 DIAGNOSIS — Z23 Encounter for immunization: Secondary | ICD-10-CM | POA: Diagnosis not present

## 2020-11-17 DIAGNOSIS — N889 Noninflammatory disorder of cervix uteri, unspecified: Secondary | ICD-10-CM | POA: Diagnosis not present

## 2020-11-17 DIAGNOSIS — Z01419 Encounter for gynecological examination (general) (routine) without abnormal findings: Secondary | ICD-10-CM | POA: Diagnosis not present

## 2020-11-23 ENCOUNTER — Ambulatory Visit
Admission: RE | Admit: 2020-11-23 | Discharge: 2020-11-23 | Disposition: A | Discharge status: Home / Self Care | Payer: BC Managed Care – PPO | Source: Ambulatory Visit | Attending: Family Medicine | Admitting: Family Medicine

## 2020-11-23 ENCOUNTER — Other Ambulatory Visit: Payer: Self-pay

## 2020-11-23 DIAGNOSIS — Z1231 Encounter for screening mammogram for malignant neoplasm of breast: Secondary | ICD-10-CM | POA: Diagnosis not present

## 2020-11-23 DIAGNOSIS — Z0000 Encounter for general adult medical examination without abnormal findings: Secondary | ICD-10-CM

## 2020-11-29 DIAGNOSIS — N939 Abnormal uterine and vaginal bleeding, unspecified: Secondary | ICD-10-CM | POA: Diagnosis not present

## 2020-11-30 DIAGNOSIS — Z23 Encounter for immunization: Secondary | ICD-10-CM | POA: Diagnosis not present

## 2020-12-08 DIAGNOSIS — N939 Abnormal uterine and vaginal bleeding, unspecified: Secondary | ICD-10-CM | POA: Diagnosis not present

## 2020-12-08 DIAGNOSIS — Z3202 Encounter for pregnancy test, result negative: Secondary | ICD-10-CM | POA: Diagnosis not present

## 2020-12-14 DIAGNOSIS — Z3202 Encounter for pregnancy test, result negative: Secondary | ICD-10-CM | POA: Diagnosis not present

## 2020-12-14 DIAGNOSIS — N939 Abnormal uterine and vaginal bleeding, unspecified: Secondary | ICD-10-CM | POA: Diagnosis not present

## 2020-12-22 DIAGNOSIS — N882 Stricture and stenosis of cervix uteri: Secondary | ICD-10-CM | POA: Diagnosis not present

## 2020-12-22 DIAGNOSIS — Z9229 Personal history of other drug therapy: Secondary | ICD-10-CM | POA: Diagnosis not present

## 2020-12-22 DIAGNOSIS — N939 Abnormal uterine and vaginal bleeding, unspecified: Secondary | ICD-10-CM | POA: Diagnosis not present

## 2020-12-27 ENCOUNTER — Ambulatory Visit: Payer: BC Managed Care – PPO | Admitting: Hematology and Oncology

## 2021-01-05 ENCOUNTER — Encounter (HOSPITAL_BASED_OUTPATIENT_CLINIC_OR_DEPARTMENT_OTHER): Payer: Self-pay | Admitting: Obstetrics and Gynecology

## 2021-01-05 ENCOUNTER — Other Ambulatory Visit: Payer: Self-pay

## 2021-01-05 ENCOUNTER — Other Ambulatory Visit: Payer: Self-pay | Admitting: Obstetrics and Gynecology

## 2021-01-05 NOTE — Progress Notes (Signed)
Spoke w/ via phone for pre-op interview--- pt Lab needs dos----  [employer] and ekg           Lab results------ no COVID test -----patient states asymptomatic no test needed Arrive at ------- 0700 on 01-10-2021 NPO after MN NO Solid Food.  Clear liquids from MN until--- 0600 Med rec completed Medications to take morning of surgery ----- none Diabetic medication ----- n/a Patient instructed no nail polish to be worn day of surgery Patient instructed to bring photo id and insurance card day of surgery Patient aware to have Driver (ride ) / caregiver for 24 hours after surgery --husband, kevin Patient Special Instructions ----- n/a Pre-Op special Istructions ----- n/a Patient verbalized understanding of instructions that were given at this phone interview. Patient denies shortness of breath, chest pain, fever, cough at this phone interview.

## 2021-01-09 ENCOUNTER — Other Ambulatory Visit: Payer: Self-pay | Admitting: Obstetrics and Gynecology

## 2021-01-09 NOTE — Anesthesia Preprocedure Evaluation (Addendum)
Anesthesia Evaluation  Patient identified by MRN, date of birth, ID band Patient awake    Reviewed: Allergy & Precautions, NPO status , Patient's Chart, lab work & pertinent test results  History of Anesthesia Complications (+) PONVNegative for: history of anesthetic complications  Airway Mallampati: II  TM Distance: >3 FB Neck ROM: Full    Dental  (+) Teeth Intact, Dental Advisory Given   Pulmonary neg pulmonary ROS,    Pulmonary exam normal        Cardiovascular hypertension, Pt. on medications Normal cardiovascular exam     Neuro/Psych negative neurological ROS     GI/Hepatic Neg liver ROS, Celiac disease   Endo/Other  negative endocrine ROS  Renal/GU negative Renal ROS  negative genitourinary   Musculoskeletal  (+) Arthritis , Rheumatoid disorders,    Abdominal   Peds  Hematology H/o of breast cancer on tamoxifen   Anesthesia Other Findings   Reproductive/Obstetrics AUB, cervical stenosis                            Anesthesia Physical Anesthesia Plan  ASA: 2  Anesthesia Plan: General   Post-op Pain Management:    Induction: Intravenous  PONV Risk Score and Plan: 3 and Ondansetron, Dexamethasone, Midazolam and Treatment may vary due to age or medical condition  Airway Management Planned: LMA  Additional Equipment: None  Intra-op Plan:   Post-operative Plan: Extubation in OR  Informed Consent: I have reviewed the patients History and Physical, chart, labs and discussed the procedure including the risks, benefits and alternatives for the proposed anesthesia with the patient or authorized representative who has indicated his/her understanding and acceptance.     Dental advisory given  Plan Discussed with:   Anesthesia Plan Comments:        Anesthesia Quick Evaluation

## 2021-01-09 NOTE — H&P (Deleted)
  The note originally documented on this encounter has been moved the the encounter in which it belongs.  

## 2021-01-09 NOTE — Progress Notes (Incomplete)
Patient Care Team: Gaynelle Arabian, MD as PCP - General (Family Medicine)  DIAGNOSIS: No diagnosis found.  CHIEF COMPLIANT: Follow-up of atypical lobular hyperplasia  INTERVAL HISTORY: Wendy Waller is a 55 y.o. with above-mentioned history of left breast atypical lobular hyperplasia currently on tamoxifen. Mammogram on 11/23/2020 showed no evidence of malignancy. She presents to the clinic today for follow-up.   ALLERGIES:  is allergic to shellfish allergy and sulfa antibiotics.  MEDICATIONS:  Current Outpatient Medications  Medication Sig Dispense Refill   Adalimumab (HUMIRA) 40 MG/0.4ML PSKT Inject into the skin every 14 (fourteen) days.     colestipol (COLESTID) 1 g tablet Take 2 g by mouth 2 (two) times daily as needed.     diclofenac (VOLTAREN) 75 MG EC tablet Take 75 mg by mouth 2 (two) times daily.     diclofenac Sodium (VOLTAREN) 1 % GEL Apply topically 4 (four) times daily as needed.     dicyclomine (BENTYL) 20 MG tablet Take 20 mg by mouth every 6 (six) hours.     diphenhydrAMINE (BENADRYL) 25 MG tablet Take 25 mg by mouth at bedtime as needed.     hydroxychloroquine (PLAQUENIL) 200 MG tablet Take 1 tablet (200 mg total) by mouth 2 (two) times daily. (Patient taking differently: Take 400 mg by mouth at bedtime.)     lisinopril (ZESTRIL) 10 MG tablet Take 10 mg by mouth at bedtime.     loratadine (CLARITIN) 10 MG tablet Take 10 mg by mouth daily.     tamoxifen (NOLVADEX) 20 MG tablet TAKE 1 TABLET BY MOUTH EVERY DAY (Patient taking differently: Take 10 mg by mouth at bedtime.) 90 tablet 3   No current facility-administered medications for this visit.    PHYSICAL EXAMINATION: ECOG PERFORMANCE STATUS: {CHL ONC ECOG [id number]}  There were no vitals filed for this visit. There were no vitals filed for this visit.  BREAST:*** No palpable masses or nodules in either right or left breasts. No palpable axillary supraclavicular or infraclavicular adenopathy no breast  tenderness or nipple discharge. (exam performed in the presence of a chaperone)  LABORATORY DATA:  I have reviewed the data as listed CMP Latest Ref Rng & Units 06/01/2014 08/18/2013 04/30/2007  Glucose 70 - 140 mg/dl 87 118 95  BUN 7.0 - 26.0 mg/dL 14.7 17.1 13  Creatinine 0.6 - 1.1 mg/dL 0.8 0.8 0.58  Sodium 136 - 145 mEq/L 139 138 131(L)  Potassium 3.5 - 5.1 mEq/L 4.0 4.2 3.6  Chloride - - - 97  CO2 22 - 29 mEq/L 26 27 23   Calcium 8.4 - 10.4 mg/dL 8.8 9.5 9.4  Total Protein 6.4 - 8.3 g/dL 7.4 7.6 -  Total Bilirubin 0.20 - 1.20 mg/dL 0.26 0.44 -  Alkaline Phos 40 - 150 U/L 55 67 -  AST 5 - 34 U/L 17 17 -  ALT 0 - 55 U/L 15 15 -    Lab Results  Component Value Date   WBC 4.7 06/01/2014   HGB 13.0 06/01/2014   HCT 36.9 06/01/2014   MCV 94.1 06/01/2014   PLT 187 06/01/2014   NEUTROABS 2.9 06/01/2014    ASSESSMENT & PLAN:  No problem-specific Assessment & Plan notes found for this encounter.    No orders of the defined types were placed in this encounter.  The patient has a good understanding of the overall plan. she agrees with it. she will call with any problems that may develop before the next visit here.  Total time spent: ***  mins including face to face time and time spent for planning, charting and coordination of care  Rulon Eisenmenger, MD, MPH 01/09/2021  I, Thana Ates, am acting as scribe for Dr. Nicholas Lose.  {insert scribe attestation}

## 2021-01-09 NOTE — H&P (Signed)
Reason for Appointment  1. Surgical Consult/Wendy Waller   History of Present Illness  General:  55 yo presents for a surgical consult.  Waller was last seen on 12/14/2020 for an EMB and an insufficient sample was taken as suspected due to Waller anatomy and was discussed with Waller at the time of the biopsy. Waller reported she would like to schedule a surgical consult with Dr. Landry Mellow to discuss hysteroscopy D/C for further evaluation of postmenopausal bleeding. In review, taking low dose of tamoxifen daily for LCIS left breast. Reports no bleeding until one day vaginal bleeding 10/11/2020. She states she was not taking tamoxifen consistently around time of vaginal bleeding. No pelvic pain. Has experienced AUB previously on tamoxifen.  Pelvic US 11/29/2020 notable for several small fibroids (2.4cm, 2.0cm, 1.8cm, 1.1cm) and mildly thickened endometrium (0.49cm).  TODAY:  patient reports periodic spotting. It is random. she had an endometrial ablation over 10 years ago.  Given Waller history, recommended hysteroscopy D/C possible polypectomy with myosure to rule out hyperplasia or malignancy. Discussed hysteroscopy D/C procedure with Waller. Waller is advised she will be able to return home the same day if she is doing well. Discussed risks of hysteroscopy including but not limited to infection, bleeding, possible perforation of the uterus, with the need for further surgery. Waller advised to avoid NSAIDs (Aspirin, Aleve, Advil, Ibuprofen, Motrin) from now until surgery given risk of bleeding during surgery. She may take Tylenol for pain management. She is advised to avoid eating or drinking starting midnight prior to surgery. Discussed post-surgery avoidance of driving for 24 hours or intercourse for 2 weeks after procedure. Provided Waller w/ Cytotec to use vaginally the night prior to her surgery and the morning of her surgery. 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 Does patient report new onset of COVID  symptoms? No. Has patient or close contact tested positive for COVID-19? No , not in the past 2 weeks.    Current Medications  Taking   Lisinopril 5 MG Tablet 1 tablet Orally Once a day  Azelastine HCl 137 MCG/SPRAY Solution 2 sprays in each nostril Nasally *Twice a day as needed, Notes: prn  Bentyl(Dicyclomine HCl) 20 MG Tablet 1 tablet as needed Orally Four times a day, Notes: prn  Colestipol HCl 1 GM Tablet 2 tablets Orally Once a day as needed, Notes: prn  Diclofenac Sodium 75 MG Tablet Delayed Release 1 tablet Orally Twice a day  Humira(Adalimumab) 40 MG/0.4ML Prefilled Syringe Kit 0.4 ml Subcutaneous every other week  Plaquenil(Hydroxychloroquine Sulfate) 200 MG Tablet 2 tablets with food or milk Orally Once a day as needed  Tamoxifen Citrate 20 MG Tablet 1/2 tablet Orally Once a day, Notes: 1/4-1/2 tablet  ZyrTEC Allergy(Cetirizine HCl) 10 MG Tablet 1 tablet Orally Once a day as needed, Notes: prn  Medication List reviewed and reconciled with the patient   Past Medical History  Hypertension (04/2016).   Celiac disease.   Connective tissue disease: ANA 1:160, low pos dsDNA-then negative, C3, C4 normal, fatigue, arthralgias. periarticular osteopenia of hand joints, polyarthritis inflammatory of hands, shoulders.   Irregular periods- resolved with ablation.   Lobular carcinoma in situ of Left breast (06/2013) (annual mammo +/-MRI due to dense breast tissue).   Adenomatous colon polyp.    Surgical History  tubal ligation 2001  wisdom teeth 1884  ACL repair left 1992  lymph node removal 2001  Left ACL repair and menisectomy 08/2010  colonoscopy 01/10/17  endoscopy 01/10/17  arthroscopy 2012  lobular  left removal/atypical lobular cancinoma in situ 2014  Total right knee replacment   Total Left knee replacement    Family History  Father: deceased, diagnosed with Hypertension  Mother: deceased, diagnosed with Hypertension, CHF (congestive heart failure)  Maternal Grand  Mother: deceased 30 yrs, MI  Brother 1: DM, ESRD, CVA  1 brother(s) , 1 sister(s) . 2 daughter(s) .   Maternal cousins with Breast Cancer- no other GYN family cancer. No autoimmune disease\\nneg family hx of colon cancer\\\/polyps or liver disease.   Social History  General:  Tobacco use cigarettes: Never smoked, Tobacco history last updated 12/22/2020. no EXPOSURE TO PASSIVE SMOKE. Alcohol: yes, weekends, social. Caffeine: yes, 2 serving daily, coffee, tea, soda, very little. no Recreational drug use. Exercise: tennis, regularly, exercises regularly. Marital Status: married. Children: girls, 2. OCCUPATION: teacher- preschool.    Gyn History  Sexual activity currently sexually active.  Periods : irregular.  LMP several months ago - 1 year 10/11/2020-spotting.  Birth control BTL.  Last pap smear date 11/17/2020 NILM, HRHPV neg.  Last mammogram date 11/23/2020.  Denies H/O Abnormal pap smear.  Denies H/O STD.  Menarche 50.  Other: Colonoscopy 01-10-2017 Tubular adenoma, repeat in 5 years.    OB History  miscarriages 1 - no D & C.  Pregnancy # 1 miscarriage, no D & C.  Pregnancy # 2 live birth, vaginal delivery, girl--09/05/1994, 6+10, 38 wks.  Pregnancy # 3 live birth, vaginal delivery, girl, 04/18/1998, 6+3, 39 wks.    Allergies  Sulfa Drugs: Rash (02/2015) - Allergy   Hospitalization/Major Diagnostic Procedure  see surgery   childbirth x 2 vaginal   not recently 06/2017  Not in the past year 08/2018   Review of Systems  CONSTITUTIONAL:  Chills No. Fatigue No. Fever No. Night sweats yes. Recent travel outside [place] No. Sweats No. Weight change No.  OPHTHALMOLOGY:  Blurring of vision no. Change in vision no. Double vision no.  ENT:  Dizziness no. Nose bleeds no. Sore throat no. Teeth pain no.  ALLERGY:  Hives no.  CARDIOLOGY:  Chest pain no. High blood pressure no. Irregular heart beat no. Leg edema no. Palpitations no.  RESPIRATORY:  Shortness of breath no. Cough no. Wheezing no.   UROLOGY:  Pain with urination no. Urinary urgency no. Urinary frequency no. Urinary incontinence no. Difficulty urinating No. Blood in urine No.  GASTROENTEROLOGY:  Abdominal pain no. Appetite change no. Bloating/belching no. Blood in stool or on toilet paper no. Change in bowel movements no. Constipation no. Diarrhea yes. Difficulty swallowing no. Nausea no.  FEMALE REPRODUCTIVE:  Vulvar pain no. Vulvar rash no. Abnormal vaginal bleeding yes. Breast pain no. Nipple discharge no. Pain with intercourse no. Pelvic pain no. Unusual vaginal discharge no. Vaginal itching no.  MUSCULOSKELETAL:  Muscle aches no.  NEUROLOGY:  Headache no. Tingling/numbness no. Weakness no.  PSYCHOLOGY:  Depression no. Anxiety no. Nervousness no. Sleep disturbances no. Suicidal ideation no .  SKIN:  Hives yes.  ENDOCRINOLOGY:  Excessive thirst no. Excessive urination no. Hair loss no. Heat or cold intolerance no.  HEMATOLOGY/LYMPH:  Abnormal bleeding no. Easy bruising no. Swollen glands no.  DERMATOLOGY:  New/changing skin lesion no. Rash no. Sores no   Vital Signs  Wt 117.0, Wt change 1.7 lbs, Ht 60.5, BMI 22.47, Pulse sitting 72, BP sitting 114/82.   Examination  General Examination: CONSTITUTIONAL: alert, oriented, NAD. SKIN: moist, warm. EYES: Conjunctiva clear. LUNGS: clear to auscultation bilaterally. HEART: regular sinus rhythm, regular rate and rhythm. ABDOMEN: soft, non-tender/non-distended,  bowel sounds present. FEMALE GENITOURINARY: normal external genitalia, labia - unremarkable, vagina - pink moist mucosa, no lesions or abnormal discharge, cervix - no discharge or lesions or CMT, adnexa - no masses or tenderness, uterus - nontender and normal size on palpation. PSYCH: affect normal, good eye contact.     Physical Examination  Chaperone present:  Chaperone present Beather Arbour 12/22/2020 03:04:06 PM > , for pelvic exam.     Assessments     1. Abnormal uterine bleeding (AUB) - N93.9 (Primary)    2. History of tamoxifen therapy - Z92.29   3. Cervical stenosis (uterine cervix) - N88.2   Treatment  1. Abnormal uterine bleeding (AUB)  Notes: Given Waller history, recommended hysteroscopy D/C possible polypectomy with myosure. Discussed hysteroscopy D/C procedure with Waller. Waller is advised she will be able to return home the same day if she is doing well. Discussed risks of hysteroscopy including but not limited to infection, bleeding, possible perforation of the uterus, with the need for further surgery. Waller advised to avoid NSAIDs (Aspirin, Aleve, Advil, Ibuprofen, Motrin) from now until surgery given risk of bleeding during surgery. She may take Tylenol for pain management. She is advised to avoid eating or drinking starting midnight prior to surgery. Discussed post-surgery avoidance of driving for 24 hours or intercourse for 2 weeks after procedure. Provided Waller w/ Cytotec to use vaginally the night prior to her surgery and the morning of her surgery.  Referral To: Reason:Please check insurance coverage of hysteroscopy D/C possible polypectomy with myosure.     2. History of tamoxifen therapy  Notes:  Waller is taking low dose of tamoxifen daily for LCIS left breast. Reports no bleeding until one day vaginal bleeding 10/11/2020. She states she was not taking tamoxifen consistently around time of vaginal bleeding. No pelvic pain. Has experienced AUB previously on tamoxifen.  Referral To: Reason:Please check insurance coverage of hysteroscopy D/C possible polypectomy with myosure.     3. Cervical stenosis (uterine cervix)  Notes: Given Waller history, recommended hysteroscopy D/C possible polypectomy with myosure. Discussed hysteroscopy D/C procedure with Waller. Waller is advised she will be able to return home the same day if she is doing well. Discussed risks of hysteroscopy including but not limited to infection, bleeding, possible perforation of the uterus, with the need for further surgery. Waller advised to  avoid NSAIDs (Aspirin, Aleve, Advil, Ibuprofen, Motrin) from now until surgery given risk of bleeding during surgery. She may take Tylenol for pain management. She is advised to avoid eating or drinking starting midnight prior to surgery. Discussed post-surgery avoidance of driving for 24 hours or intercourse for 2 weeks after procedure. Provided Waller w/ Cytotec to use vaginally the night prior to her surgery and the morning of her surgery.  Referral To: Reason:Please check insurance coverage of hysteroscopy D/C possible polypectomy with myosure.

## 2021-01-10 ENCOUNTER — Encounter (HOSPITAL_BASED_OUTPATIENT_CLINIC_OR_DEPARTMENT_OTHER)
Admission: RE | Disposition: A | Discharge status: Home / Self Care | Payer: Self-pay | Source: Home / Self Care | Attending: Obstetrics and Gynecology

## 2021-01-10 ENCOUNTER — Ambulatory Visit (HOSPITAL_BASED_OUTPATIENT_CLINIC_OR_DEPARTMENT_OTHER): Payer: BC Managed Care – PPO | Admitting: Anesthesiology

## 2021-01-10 ENCOUNTER — Ambulatory Visit: Payer: BC Managed Care – PPO | Admitting: Hematology and Oncology

## 2021-01-10 ENCOUNTER — Encounter (HOSPITAL_BASED_OUTPATIENT_CLINIC_OR_DEPARTMENT_OTHER): Payer: Self-pay | Admitting: Obstetrics and Gynecology

## 2021-01-10 ENCOUNTER — Ambulatory Visit (HOSPITAL_BASED_OUTPATIENT_CLINIC_OR_DEPARTMENT_OTHER)
Admission: RE | Admit: 2021-01-10 | Discharge: 2021-01-10 | Disposition: A | Discharge status: Home / Self Care | Payer: BC Managed Care – PPO | Attending: Obstetrics and Gynecology | Admitting: Obstetrics and Gynecology

## 2021-01-10 ENCOUNTER — Other Ambulatory Visit: Payer: Self-pay

## 2021-01-10 DIAGNOSIS — N882 Stricture and stenosis of cervix uteri: Secondary | ICD-10-CM | POA: Diagnosis not present

## 2021-01-10 DIAGNOSIS — N939 Abnormal uterine and vaginal bleeding, unspecified: Secondary | ICD-10-CM | POA: Insufficient documentation

## 2021-01-10 DIAGNOSIS — K580 Irritable bowel syndrome with diarrhea: Secondary | ICD-10-CM | POA: Diagnosis not present

## 2021-01-10 DIAGNOSIS — N840 Polyp of corpus uteri: Secondary | ICD-10-CM | POA: Diagnosis not present

## 2021-01-10 DIAGNOSIS — N950 Postmenopausal bleeding: Secondary | ICD-10-CM | POA: Diagnosis present

## 2021-01-10 DIAGNOSIS — I10 Essential (primary) hypertension: Secondary | ICD-10-CM | POA: Diagnosis not present

## 2021-01-10 HISTORY — DX: Irritable bowel syndrome with diarrhea: K58.0

## 2021-01-10 HISTORY — DX: Encounter for therapeutic drug level monitoring: Z51.81

## 2021-01-10 HISTORY — DX: Abnormal uterine and vaginal bleeding, unspecified: N93.9

## 2021-01-10 HISTORY — DX: Rheumatoid arthritis, unspecified: M06.9

## 2021-01-10 HISTORY — DX: Stricture and stenosis of cervix uteri: N88.2

## 2021-01-10 HISTORY — DX: Personal history of diseases of the blood and blood-forming organs and certain disorders involving the immune mechanism: Z86.2

## 2021-01-10 HISTORY — DX: Presence of external hearing-aid: Z97.4

## 2021-01-10 HISTORY — PX: DILATATION & CURETTAGE/HYSTEROSCOPY WITH MYOSURE: SHX6511

## 2021-01-10 HISTORY — DX: Unspecified osteoarthritis, unspecified site: M19.90

## 2021-01-10 LAB — POCT I-STAT, CHEM 8
BUN: 16 mg/dL (ref 6–20)
Calcium, Ion: 1.27 mmol/L (ref 1.15–1.40)
Chloride: 102 mmol/L (ref 98–111)
Creatinine, Ser: 0.6 mg/dL (ref 0.44–1.00)
Glucose, Bld: 91 mg/dL (ref 70–99)
HCT: 39 % (ref 36.0–46.0)
Hemoglobin: 13.3 g/dL (ref 12.0–15.0)
Potassium: 4.1 mmol/L (ref 3.5–5.1)
Sodium: 143 mmol/L (ref 135–145)
TCO2: 31 mmol/L (ref 22–32)

## 2021-01-10 SURGERY — DILATATION & CURETTAGE/HYSTEROSCOPY WITH MYOSURE
Anesthesia: General

## 2021-01-10 MED ORDER — LACTATED RINGERS IV SOLN: INTRAVENOUS | Status: DC

## 2021-01-10 MED ORDER — SODIUM CHLORIDE 0.9 % IR SOLN
Status: DC | PRN
  Administered 2021-01-10: 3000 mL

## 2021-01-10 MED ORDER — PHENYLEPHRINE 40 MCG/ML (10ML) SYRINGE FOR IV PUSH (FOR BLOOD PRESSURE SUPPORT)
PREFILLED_SYRINGE | INTRAVENOUS | Status: AC
  Filled 2021-01-10: qty 10

## 2021-01-10 MED ORDER — ONDANSETRON HCL 4 MG/2ML IJ SOLN: 4.0000 mg | Freq: Once | INTRAMUSCULAR | Status: DC | PRN

## 2021-01-10 MED ORDER — MIDAZOLAM HCL 5 MG/5ML IJ SOLN
INTRAMUSCULAR | Status: DC | PRN
  Administered 2021-01-10: 2 mg via INTRAVENOUS

## 2021-01-10 MED ORDER — BUPIVACAINE HCL (PF) 0.25 % IJ SOLN
INTRAMUSCULAR | Status: DC | PRN
  Administered 2021-01-10: 20 mL

## 2021-01-10 MED ORDER — ACETAMINOPHEN 500 MG PO TABS
ORAL_TABLET | ORAL | Status: AC
  Filled 2021-01-10: qty 2

## 2021-01-10 MED ORDER — AMISULPRIDE (ANTIEMETIC) 5 MG/2ML IV SOLN: 10.0000 mg | Freq: Once | INTRAVENOUS | Status: DC | PRN

## 2021-01-10 MED ORDER — FENTANYL CITRATE (PF) 100 MCG/2ML IJ SOLN
INTRAMUSCULAR | Status: AC
  Filled 2021-01-10: qty 2

## 2021-01-10 MED ORDER — FENTANYL CITRATE (PF) 100 MCG/2ML IJ SOLN
INTRAMUSCULAR | Status: DC | PRN
  Administered 2021-01-10 (×4): 25 ug via INTRAVENOUS

## 2021-01-10 MED ORDER — ACETAMINOPHEN 500 MG PO TABS: 1000.0000 mg | ORAL_TABLET | Freq: Once | ORAL | Status: DC

## 2021-01-10 MED ORDER — MIDAZOLAM HCL 2 MG/2ML IJ SOLN
INTRAMUSCULAR | Status: AC
  Filled 2021-01-10: qty 2

## 2021-01-10 MED ORDER — KETOROLAC TROMETHAMINE 30 MG/ML IJ SOLN
INTRAMUSCULAR | Status: DC | PRN
  Administered 2021-01-10: 30 mg via INTRAVENOUS

## 2021-01-10 MED ORDER — OXYCODONE HCL 5 MG PO TABS: 5.0000 mg | ORAL_TABLET | Freq: Once | ORAL | Status: DC | PRN

## 2021-01-10 MED ORDER — EPHEDRINE 5 MG/ML INJ
INTRAVENOUS | Status: AC
  Filled 2021-01-10: qty 5

## 2021-01-10 MED ORDER — ACETAMINOPHEN 500 MG PO TABS: 1000.0000 mg | ORAL_TABLET | Freq: Three times a day (TID) | ORAL | 0 refills | Status: DC | PRN

## 2021-01-10 MED ORDER — PROPOFOL 10 MG/ML IV BOLUS
INTRAVENOUS | Status: DC | PRN
  Administered 2021-01-10: 150 mg via INTRAVENOUS

## 2021-01-10 MED ORDER — DEXAMETHASONE SODIUM PHOSPHATE 10 MG/ML IJ SOLN
INTRAMUSCULAR | Status: AC
  Filled 2021-01-10: qty 1

## 2021-01-10 MED ORDER — IBUPROFEN 800 MG PO TABS: 800.0000 mg | ORAL_TABLET | Freq: Three times a day (TID) | ORAL | 0 refills | Status: DC | PRN

## 2021-01-10 MED ORDER — ONDANSETRON HCL 4 MG/2ML IJ SOLN
INTRAMUSCULAR | Status: AC
  Filled 2021-01-10: qty 2

## 2021-01-10 MED ORDER — ONDANSETRON HCL 4 MG/2ML IJ SOLN
INTRAMUSCULAR | Status: DC | PRN
  Administered 2021-01-10: 4 mg via INTRAVENOUS

## 2021-01-10 MED ORDER — ACETAMINOPHEN 500 MG PO TABS
1000.0000 mg | ORAL_TABLET | ORAL | Status: AC
  Administered 2021-01-10: 1000 mg via ORAL

## 2021-01-10 MED ORDER — FENTANYL CITRATE (PF) 100 MCG/2ML IJ SOLN: 25.0000 ug | INTRAMUSCULAR | Status: DC | PRN

## 2021-01-10 MED ORDER — PROPOFOL 10 MG/ML IV BOLUS
INTRAVENOUS | Status: AC
  Filled 2021-01-10: qty 20

## 2021-01-10 MED ORDER — EPHEDRINE SULFATE 50 MG/ML IJ SOLN
INTRAMUSCULAR | Status: DC | PRN
  Administered 2021-01-10 (×3): 10 mg via INTRAVENOUS

## 2021-01-10 MED ORDER — PHENYLEPHRINE HCL (PRESSORS) 10 MG/ML IV SOLN
INTRAVENOUS | Status: DC | PRN
  Administered 2021-01-10 (×2): 40 ug via INTRAVENOUS

## 2021-01-10 MED ORDER — LIDOCAINE 2% (20 MG/ML) 5 ML SYRINGE
INTRAMUSCULAR | Status: AC
  Filled 2021-01-10: qty 5

## 2021-01-10 MED ORDER — OXYCODONE HCL 5 MG/5ML PO SOLN: 5.0000 mg | Freq: Once | ORAL | Status: DC | PRN

## 2021-01-10 MED ORDER — LIDOCAINE HCL (CARDIAC) PF 100 MG/5ML IV SOSY
PREFILLED_SYRINGE | INTRAVENOUS | Status: DC | PRN
  Administered 2021-01-10: 60 mg via INTRAVENOUS

## 2021-01-10 MED ORDER — POVIDONE-IODINE 10 % EX SWAB: 2.0000 "application " | Freq: Once | CUTANEOUS | Status: DC

## 2021-01-10 MED ORDER — KETOROLAC TROMETHAMINE 30 MG/ML IJ SOLN
INTRAMUSCULAR | Status: AC
  Filled 2021-01-10: qty 1

## 2021-01-10 MED ORDER — DEXAMETHASONE SODIUM PHOSPHATE 4 MG/ML IJ SOLN
INTRAMUSCULAR | Status: DC | PRN
Start: 2021-01-10 — End: 2021-01-10
  Administered 2021-01-10: 5 mg via INTRAVENOUS

## 2021-01-10 SURGICAL SUPPLY — 11 items
CATH ROBINSON RED A/P 16FR (CATHETERS) ×2 IMPLANT
DEVICE MYOSURE REACH (MISCELLANEOUS) ×2 IMPLANT
GAUZE 4X4 16PLY ~~LOC~~+RFID DBL (SPONGE) ×3 IMPLANT
GLOVE SURG ENC TEXT LTX SZ6.5 (GLOVE) ×3 IMPLANT
GLOVE SURG UNDER POLY LF SZ6.5 (GLOVE) ×6 IMPLANT
GOWN STRL REUS W/TWL LRG LVL3 (GOWN DISPOSABLE) ×6 IMPLANT
KIT PROCEDURE FLUENT (KITS) ×3 IMPLANT
KIT TURNOVER CYSTO (KITS) ×3 IMPLANT
PACK VAGINAL MINOR WOMEN LF (CUSTOM PROCEDURE TRAY) ×3 IMPLANT
PAD OB MATERNITY 4.3X12.25 (PERSONAL CARE ITEMS) ×3 IMPLANT
SEAL ROD LENS SCOPE MYOSURE (ABLATOR) ×3 IMPLANT

## 2021-01-10 NOTE — H&P (Signed)
Date of Initial H&P: 01/09/2021  History reviewed, patient examined, no change in status, stable for surgery.

## 2021-01-10 NOTE — Discharge Instructions (Addendum)
DISCHARGE INSTRUCTIONS: HYSTEROSCOPY  The following instructions have been prepared to help you care for yourself upon your return home.  May take Ibuprofen after 3:45pm  May take stool softner while taking narcotic pain medication to prevent constipation.  Drink plenty of water.  Personal hygiene:  Use sanitary pads for vaginal drainage, not tampons.  Shower the day after your procedure.  NO tub baths, pools or Jacuzzis for 2-3 weeks.  Wipe front to back after using the bathroom.  Activity and limitations:  Do NOT drive or operate any equipment for 24 hours. The effects of anesthesia are still present and drowsiness may result.  Do NOT rest in bed all day.  Walking is encouraged.  Walk up and down stairs slowly.  You may resume your normal activity in one to two days or as indicated by your physician. Sexual activity: NO intercourse for at least 2 weeks after the procedure, or as indicated by your Doctor.  Diet: Eat a light meal as desired this evening. You may resume your usual diet tomorrow.  Return to Work: You may resume your work activities in one to two days or as indicated by Marine scientist.  What to expect after your surgery: Expect to have vaginal bleeding/discharge for 2-3 days and spotting for up to 10 days. It is not unusual to have soreness for up to 1-2 weeks. You may have a slight burning sensation when you urinate for the first day. Mild cramps may continue for a couple of days. You may have a regular period in 2-6 weeks.  Call your doctor for any of the following:  Excessive vaginal bleeding or clotting, saturating and changing one pad every hour.  Inability to urinate 6 hours after discharge from hospital.  Pain not relieved by pain medication.  Fever of 100.4 F or greater.  Unusual vaginal discharge or odor.  Post Anesthesia Home Care Instructions  Activity: Get plenty of rest for the remainder of the day. A responsible individual must stay with you for 24  hours following the procedure.  For the next 24 hours, DO NOT: -Drive a car -Paediatric nurse -Drink alcoholic beverages -Take any medication unless instructed by your physician -Make any legal decisions or sign important papers.  Meals: Start with liquid foods such as gelatin or soup. Progress to regular foods as tolerated. Avoid greasy, spicy, heavy foods. If nausea and/or vomiting occur, drink only clear liquids until the nausea and/or vomiting subsides. Call your physician if vomiting continues.  Special Instructions/Symptoms: Your throat may feel dry or sore from the anesthesia or the breathing tube placed in your throat during surgery. If this causes discomfort, gargle with warm salt water. The discomfort should disappear within 24 hours.

## 2021-01-10 NOTE — Op Note (Signed)
01/10/2021  9:56 AM  PATIENT:  Wendy Waller  55 y.o. female  PRE-OPERATIVE DIAGNOSIS:  Abnormal Uterine Bleeding History of Tamoxifen Therapy Cervical Stenosis  POST-OPERATIVE DIAGNOSIS:  Abnormal Uterine Bleeding History of Tamoxifen Therapy  PROCEDURE:  Procedure(s): DILATATION & CURETTAGE/HYSTEROSCOPY WITH MYOSURE POLYPECTOMY. (N/A)  SURGEON:  Surgeon(s) and Role:    Christophe Louis, MD - Primary  PHYSICIAN ASSISTANT: None  ASSISTANTS: none   ANESTHESIA:   general  EBL:  5 mL   BLOOD ADMINISTERED:none  DRAINS: none   LOCAL MEDICATIONS USED:  MARCAINE     SPECIMEN:  Source of Specimen:  endometrial polyp and limited endometrial curetting's   DISPOSITION OF SPECIMEN:  PATHOLOGY  COUNTS:  YES  TOURNIQUET:  * No tourniquets in log *  DICTATION: .Note written in EPIC  PLAN OF CARE: Discharge to home after PACU  PATIENT DISPOSITION:  PACU - hemodynamically stable.   Delay start of Pharmacological VTE agent (>24 hrs) due to surgical blood loss or risk of bleeding: not applicable  Findings: normal external genitalia. Normal vaginal mucosa. Normal cervix.  Endometrial scarring most likely from previous endometrial ablation. A small endometrial polyp was noted. Surrounding endometrial was atrophic. Anterior to the polyp there was scarring and the hysteroscope could not be advance.   Procedure: Patient was taken to the operating room  #5 at [facility], where she was placed under general anesthesia. She was placed in the dorsal lithotomy position. She was prepped and draped in the usual sterile fashion. A speculum was placed into the vaginal vault. The anterior lip of the cervix was grasped with a single-tooth tenaculum. Quarter percent Marcaine was injected at the 4 and 8:00 positions of the cervix. When attempting to sound the uterus resistance was encountered and sound would only advance to 3 cm. The external os was dilated  to approximately 6 mm. Mysosure operative  hysteroscope  was inserted to attempt hydro-dilation.  The findings noted above. Myosure reach  blade was introduced through the hysteroscope. The endometrial  polyp was removed in less than 30 seconds. Sample of surrounding endometrium was obtained with Myosure reach blade.  Anterior to the polyp there was scarring and the hysteroscope could not be advance.  There was no evidence of perforation. Hysteroscope was then removed.   The single-tooth tenaculum was removed from the anterior lip of the cervix. Excellent hemostasis was noted. The speculum was removed from the patient's vagina. She was awakened from anesthesia. She was taken to the recovery  room awake and in stable condition. Sponge lap and needle counts were correct x 2.

## 2021-01-10 NOTE — Transfer of Care (Signed)
Immediate Anesthesia Transfer of Care Note  Patient: Wendy Waller  Procedure(s) Performed: Procedure(s) (LRB): DILATATION & CURETTAGE/HYSTEROSCOPY WITH MYOSURE POLYPECTOMY. (N/A)  Patient Location: PACU  Anesthesia Type: General  Level of Consciousness: awake, sedated, patient cooperative and responds to stimulation  Airway & Oxygen Therapy: Patient Spontanous Breathing and Patient connected to  02 and soft FM   Post-op Assessment: Report given to PACU RN, Post -op Vital signs reviewed and stable and Patient moving all extremities  Post vital signs: Reviewed and stable  Complications: No apparent anesthesia complications

## 2021-01-10 NOTE — Anesthesia Postprocedure Evaluation (Signed)
Anesthesia Post Note  Patient: Wendy Waller  Procedure(s) Performed: DILATATION & CURETTAGE/HYSTEROSCOPY WITH MYOSURE POLYPECTOMY.     Patient location during evaluation: PACU Anesthesia Type: General Level of consciousness: awake and alert Pain management: pain level controlled Vital Signs Assessment: post-procedure vital signs reviewed and stable Respiratory status: spontaneous breathing, nonlabored ventilation and respiratory function stable Cardiovascular status: blood pressure returned to baseline and stable Postop Assessment: no apparent nausea or vomiting Anesthetic complications: no   No notable events documented.  Last Vitals:  Vitals:   01/10/21 1030 01/10/21 1059  BP: 114/77 122/79  Pulse: 85 70  Resp: 14 15  Temp: 36.7 C 36.4 C  SpO2: 100% 99%    Last Pain:  Vitals:   01/10/21 1059  TempSrc:   PainSc: 1                  Lidia Collum

## 2021-01-10 NOTE — Anesthesia Procedure Notes (Signed)
Procedure Name: LMA Insertion Date/Time: 01/10/2021 9:12 AM Performed by: Justice Rocher, CRNA Pre-anesthesia Checklist: Patient identified, Emergency Drugs available, Suction available, Patient being monitored and Timeout performed Patient Re-evaluated:Patient Re-evaluated prior to induction Oxygen Delivery Method: Circle system utilized Preoxygenation: Pre-oxygenation with 100% oxygen Induction Type: IV induction Ventilation: Mask ventilation without difficulty LMA: LMA inserted LMA Size: 3.0 Number of attempts: 1 Airway Equipment and Method: Bite block Placement Confirmation: positive ETCO2, breath sounds checked- equal and bilateral and CO2 detector Tube secured with: Tape Dental Injury: Teeth and Oropharynx as per pre-operative assessment

## 2021-01-10 NOTE — Assessment & Plan Note (Deleted)
Lobular carcinoma In situ /fibrocystic disease/atypical lobular hyperplasia/PASH of the left breast status post lumpectomy in May 2015. Status post biopsy of the right breast suspicious nodule and was negative for any malignancy or precancerous lesions; currently on observation  Breast cancer surveillance: 1. Breast exam10/29/2020: No evidence of malignancy. 2. 3D mammogram7/05/2018: Benign, breast density categoryD 3.Breast MRI 01/20/2018: No evidence of malignancy, breast density category C With no plan to do MRIs of the breast every other year.  Based on [place] for risk of breast cancer, patient has a 20% risk of breast cancer in 10 years and is 68% risk of breast cancer by the age of 54.  Tamoxifen Toxicities: Taking 10 mg daily Moderate to severe hot flashes: Patient could not take the 20 mg dose.  She went back down to 10 mg and still has hot flashes which are quite profound.  Her menstrual cycles have stopped and she is very glad about that.  Breast Cancer Surveillance:  1.  Mammogram 11/28/2020: Benign breast density category C 2. Breast Exam: 01/10/2021: Benign 3. MRI: 02/11/2019: Benign  We will plan for another MRI in December this year.  RTC in 1 year

## 2021-01-11 ENCOUNTER — Encounter (HOSPITAL_BASED_OUTPATIENT_CLINIC_OR_DEPARTMENT_OTHER): Payer: Self-pay | Admitting: Obstetrics and Gynecology

## 2021-01-11 LAB — SURGICAL PATHOLOGY

## 2021-01-26 DIAGNOSIS — U071 COVID-19: Secondary | ICD-10-CM

## 2021-01-26 HISTORY — DX: COVID-19: U07.1

## 2021-02-14 ENCOUNTER — Telehealth: Payer: Self-pay

## 2021-02-14 NOTE — Progress Notes (Signed)
Patient Care Team: Gaynelle Arabian, MD as PCP - General (Family Medicine)  DIAGNOSIS:    ICD-10-CM   1. Atypical lobular hyperplasia of left breast  N60.92         CHIEF COMPLIANT: Surveillance of atypical lobular hyperplasia  INTERVAL HISTORY: Wendy Waller is a 55 y.o. with above-mentioned history of left breast atypical lobular hyperplasia currently on tamoxifen. Mammogram on 11/23/2020 showed no evidence of malignancy. She presents to the clinic today for follow-up.   ALLERGIES:  is allergic to shellfish allergy and sulfa antibiotics.  MEDICATIONS:  Current Outpatient Medications  Medication Sig Dispense Refill   acetaminophen (TYLENOL) 500 MG tablet Take 2 tablets (1,000 mg total) by mouth every 8 (eight) hours as needed for moderate pain or mild pain. 30 tablet 0   Adalimumab (HUMIRA) 40 MG/0.4ML PSKT Inject into the skin every 14 (fourteen) days.     colestipol (COLESTID) 1 g tablet Take 2 g by mouth 2 (two) times daily as needed.     diclofenac (VOLTAREN) 75 MG EC tablet Take 75 mg by mouth 2 (two) times daily.     diclofenac Sodium (VOLTAREN) 1 % GEL Apply topically 4 (four) times daily as needed.     dicyclomine (BENTYL) 20 MG tablet Take 20 mg by mouth every 6 (six) hours.     diphenhydrAMINE (BENADRYL) 25 MG tablet Take 25 mg by mouth at bedtime as needed.     hydroxychloroquine (PLAQUENIL) 200 MG tablet Take 1 tablet (200 mg total) by mouth 2 (two) times daily. (Patient taking differently: Take 400 mg by mouth at bedtime.)     ibuprofen (ADVIL) 800 MG tablet Take 1 tablet (800 mg total) by mouth every 8 (eight) hours as needed. 30 tablet 0   lisinopril (ZESTRIL) 10 MG tablet Take 10 mg by mouth at bedtime.     loratadine (CLARITIN) 10 MG tablet Take 10 mg by mouth daily.     tamoxifen (NOLVADEX) 20 MG tablet TAKE 1 TABLET BY MOUTH EVERY DAY (Patient taking differently: Take 10 mg by mouth at bedtime.) 90 tablet 3   No current facility-administered medications for this  visit.    PHYSICAL EXAMINATION: ECOG PERFORMANCE STATUS: 1 - Symptomatic but completely ambulatory  There were no vitals filed for this visit. There were no vitals filed for this visit.  BREAST: No palpable masses or nodules in either right or left breasts. No palpable axillary supraclavicular or infraclavicular adenopathy no breast tenderness or nipple discharge. (exam performed in the presence of a chaperone)  LABORATORY DATA:  I have reviewed the data as listed CMP Latest Ref Rng & Units 01/10/2021 06/01/2014 08/18/2013  Glucose 70 - 99 mg/dL 91 87 118  BUN 6 - 20 mg/dL 16 14.7 17.1  Creatinine 0.44 - 1.00 mg/dL 0.60 0.8 0.8  Sodium 135 - 145 mmol/L 143 139 138  Potassium 3.5 - 5.1 mmol/L 4.1 4.0 4.2  Chloride 98 - 111 mmol/L 102 - -  CO2 22 - 29 mEq/L - 26 27  Calcium 8.4 - 10.4 mg/dL - 8.8 9.5  Total Protein 6.4 - 8.3 g/dL - 7.4 7.6  Total Bilirubin 0.20 - 1.20 mg/dL - 0.26 0.44  Alkaline Phos 40 - 150 U/L - 55 67  AST 5 - 34 U/L - 17 17  ALT 0 - 55 U/L - 15 15    Lab Results  Component Value Date   WBC 4.7 06/01/2014   HGB 13.3 01/10/2021   HCT 39.0 01/10/2021  MCV 94.1 06/01/2014   PLT 187 06/01/2014   NEUTROABS 2.9 06/01/2014    ASSESSMENT & PLAN:  Atypical lobular hyperplasia of left breast Lobular carcinoma In situ /fibrocystic disease/atypical lobular hyperplasia/PASH of the left breast status post lumpectomy in May 2015. Status post biopsy of the right breast suspicious nodule and was negative for any malignancy or precancerous lesions; currently on observation   Breast cancer surveillance: 1. Breast exam 12/25/2018: No evidence of malignancy. 2. 3D mammogram 08/30/2018: Benign, breast density category D 3.  Breast MRI 01/20/2018: No evidence of malignancy, breast density category C With no plan to do MRIs of the breast every other year.   Based on [place] for risk of breast cancer, patient has a 20% risk of breast cancer in 10 years and is 68% risk  of breast cancer by the age of 69.   Tamoxifen Toxicities: Taking 5 mg daily started 2019 Moderate to severe hot flashes: I instructed her to start taking the 5 mg dose 3 times a week starting 02/15/2021 Uterine hypertrophy: Patient has had multiple attempts to get an endometrial biopsy but it was not satisfactory because of prior history of ablation.  She might be undergoing hysterectomy and bilateral salpingo-oophorectomy February 2023.  Breast Cancer Surveillance:  1. 11/28/20: Benign breast density category C 2. Breast Exam: 02/15/21: Benign 3. MRI: 02/11/2019: Benign We will order an MRI of the breast to be done April 2023.    RTC in 1 year    No orders of the defined types were placed in this encounter.  The patient has a good understanding of the overall plan. she agrees with it. she will call with any problems that may develop before the next visit here.  Total time spent: 20 mins including face to face time and time spent for planning, charting and coordination of care  Rulon Eisenmenger, MD, MPH 02/15/2021  I, Thana Ates, am acting as scribe for Dr. Nicholas Lose.  I have reviewed the above documentation for accuracy and completeness, and I agree with the above.

## 2021-02-14 NOTE — Telephone Encounter (Signed)
Pt called and states she tested positive for COVID 01/30/21 and is now testing negative, wants to know if it is OK to keep appt. Per MD OK to keep same appt. Pt verbalized thanks and understanding.

## 2021-02-14 NOTE — Assessment & Plan Note (Signed)
Lobular carcinoma In situ /fibrocystic disease/atypical lobular hyperplasia/PASH of the left breast status post lumpectomy in May 2015. Status post biopsy of the right breast suspicious nodule and was negative for any malignancy or precancerous lesions; currently on observation  Breast cancer surveillance: 1. Breast exam10/29/2020: No evidence of malignancy. 2. 3D mammogram7/05/2018: Benign, breast density categoryD 3.Breast MRI 01/20/2018: No evidence of malignancy, breast density category C With no plan to do MRIs of the breast every other year.  Based on [place] for risk of breast cancer, patient has a 20% risk of breast cancer in 10 years and is 68% risk of breast cancer by the age of 60.  Tamoxifen Toxicities: Taking 10 mg daily Moderate to severe hot flashes: Patient could not take the 20 mg dose.  She went back down to 10 mg and still has hot flashes which are quite profound.  Her menstrual cycles have stopped and she is very glad about that.  Breast Cancer Surveillance:  1. 11/28/20: Benign breast density category C 2. Breast Exam: 02/15/21: Benign 3. MRI: 02/11/2019: Benign  RTC in 1 year

## 2021-02-15 ENCOUNTER — Ambulatory Visit
Admission: RE | Admit: 2021-02-15 | Discharge: 2021-02-15 | Disposition: A | Discharge status: Home / Self Care | Payer: BC Managed Care – PPO | Source: Ambulatory Visit | Attending: Family Medicine | Admitting: Family Medicine

## 2021-02-15 ENCOUNTER — Other Ambulatory Visit: Payer: Self-pay

## 2021-02-15 ENCOUNTER — Inpatient Hospital Stay: Payer: BC Managed Care – PPO | Attending: Hematology and Oncology | Admitting: Hematology and Oncology

## 2021-02-15 ENCOUNTER — Other Ambulatory Visit: Payer: Self-pay | Admitting: Family Medicine

## 2021-02-15 DIAGNOSIS — R079 Chest pain, unspecified: Secondary | ICD-10-CM | POA: Diagnosis not present

## 2021-02-15 DIAGNOSIS — N6092 Unspecified benign mammary dysplasia of left breast: Secondary | ICD-10-CM | POA: Diagnosis not present

## 2021-02-15 DIAGNOSIS — N951 Menopausal and female climacteric states: Secondary | ICD-10-CM | POA: Diagnosis not present

## 2021-02-15 DIAGNOSIS — Q676 Pectus excavatum: Secondary | ICD-10-CM | POA: Diagnosis not present

## 2021-02-15 DIAGNOSIS — R059 Cough, unspecified: Secondary | ICD-10-CM

## 2021-02-16 DIAGNOSIS — M1711 Unilateral primary osteoarthritis, right knee: Secondary | ICD-10-CM | POA: Diagnosis not present

## 2021-02-16 DIAGNOSIS — M170 Bilateral primary osteoarthritis of knee: Secondary | ICD-10-CM | POA: Diagnosis not present

## 2021-02-16 DIAGNOSIS — M1712 Unilateral primary osteoarthritis, left knee: Secondary | ICD-10-CM | POA: Diagnosis not present

## 2021-03-27 DIAGNOSIS — R5383 Other fatigue: Secondary | ICD-10-CM | POA: Diagnosis not present

## 2021-03-27 DIAGNOSIS — M2550 Pain in unspecified joint: Secondary | ICD-10-CM | POA: Diagnosis not present

## 2021-03-27 DIAGNOSIS — Z79899 Other long term (current) drug therapy: Secondary | ICD-10-CM | POA: Diagnosis not present

## 2021-03-27 DIAGNOSIS — M150 Primary generalized (osteo)arthritis: Secondary | ICD-10-CM | POA: Diagnosis not present

## 2021-03-27 DIAGNOSIS — M0609 Rheumatoid arthritis without rheumatoid factor, multiple sites: Secondary | ICD-10-CM | POA: Diagnosis not present

## 2021-04-11 DIAGNOSIS — Z9889 Other specified postprocedural states: Secondary | ICD-10-CM | POA: Diagnosis not present

## 2021-04-11 DIAGNOSIS — N950 Postmenopausal bleeding: Secondary | ICD-10-CM | POA: Diagnosis not present

## 2021-04-11 DIAGNOSIS — R9389 Abnormal findings on diagnostic imaging of other specified body structures: Secondary | ICD-10-CM | POA: Diagnosis not present

## 2021-04-11 DIAGNOSIS — Z9229 Personal history of other drug therapy: Secondary | ICD-10-CM | POA: Diagnosis not present

## 2021-04-13 ENCOUNTER — Other Ambulatory Visit: Payer: Self-pay | Admitting: *Deleted

## 2021-04-13 MED ORDER — TAMOXIFEN CITRATE 10 MG PO TABS: 10.0000 mg | ORAL_TABLET | Freq: Two times a day (BID) | ORAL | 11 refills | Status: DC

## 2021-04-26 DIAGNOSIS — R9389 Abnormal findings on diagnostic imaging of other specified body structures: Secondary | ICD-10-CM | POA: Diagnosis not present

## 2021-04-26 DIAGNOSIS — N950 Postmenopausal bleeding: Secondary | ICD-10-CM | POA: Diagnosis not present

## 2021-05-04 ENCOUNTER — Encounter (HOSPITAL_BASED_OUTPATIENT_CLINIC_OR_DEPARTMENT_OTHER): Payer: Self-pay | Admitting: Obstetrics and Gynecology

## 2021-05-05 ENCOUNTER — Encounter (HOSPITAL_BASED_OUTPATIENT_CLINIC_OR_DEPARTMENT_OTHER): Payer: Self-pay | Admitting: Obstetrics and Gynecology

## 2021-05-05 ENCOUNTER — Other Ambulatory Visit: Payer: Self-pay

## 2021-05-05 DIAGNOSIS — Z01812 Encounter for preprocedural laboratory examination: Secondary | ICD-10-CM | POA: Diagnosis not present

## 2021-05-05 NOTE — Progress Notes (Addendum)
Spoke w/ via phone for pre-op interview---[place] ?Lab needs dos----urine preg POCT , surgeon orders pending as of 05/05/21             ?Lab results------05/09/21 Lab appt for CBC, BMP, type & screen, 01/10/21 EKG in chart & Epic ?COVID test -----patient states asymptomatic no test needed ?Arrive at -------0730 on Friday, 05/12/21 ?NPO after MN NO Solid Food.  Clear liquids from MN until---0630 ?Med rec completed ?Medications to take morning of surgery -----Flonase (Patient takes all other meds at night per pt.) ?Diabetic medication -----n/a ?Patient instructed no nail polish to be worn day of surgery ?Patient instructed to bring photo id and insurance card day of surgery ?Patient aware to have Driver (ride ) / caregiver    for 24 hours after surgery - husband, Lennette Bihari ?Patient Special Instructions -----Extended recovery instructions.  ?Pre-Op special Istructions -----Patient has celiac disease and cannot eat gluten. Requested orders from Dr. Landry Mellow on 05/04/21 via Epic IB. ?Patient verbalized understanding of instructions that were given at this phone interview. ?Patient denies shortness of breath, chest pain, fever, cough at this phone interview.  ? ?Patient was at [facility] in 12/2020 for a D & C hysteroscopy. She states no changes in her medical history since that time per pt on 05/05/21. ?

## 2021-05-05 NOTE — Progress Notes (Signed)
?PLEASE WEAR A MASK OUT IN PUBLIC AND SOCIAL DISTANCE AND [place] YOUR HANDS FREQUENTLY. PLEASE ASK ALL YOUR CLOSE HOUSEHOLD CONTACT TO WEAR MASK OUT IN PUBLIC AND SOCIAL DISTANCE AND [place] HANDS FREQUENTLY ALSO. ? ? ? ? ? Your procedure is scheduled on Friday, 05/12/21. ? Report to [facility] AT  7:30 am. ? ? Call this number if you have problems the morning of surgery  :[phone]. ? ? [place] [facility].  WE ARE LOCATED IN [facility]. ? ?PLEASE BRING YOUR INSURANCE CARD AND PHOTO ID DAY OF SURGERY. ? ?ONLY ONE PERSON ALLOWED IN FACILITY WAITING AREA. ?                                   ? REMEMBER: ? DO NOT EAT FOOD, CANDY GUM OR MINTS  AFTER MIDNIGHT THE NIGHT BEFORE YOUR SURGERY . YOU MAY HAVE CLEAR LIQUIDS FROM MIDNIGHT THE NIGHT BEFORE YOUR SURGERY UNTIL  6:30 am. NO CLEAR LIQUIDS AFTER  6:30 am DAY OF SURGERY. ? ? YOU MAY  BRUSH YOUR TEETH MORNING OF SURGERY AND RINSE YOUR MOUTH OUT, NO CHEWING GUM CANDY OR MINTS. ? ? ? ?CLEAR LIQUID DIET ? ? ?Foods Allowed                                                                     Foods Excluded ? ?Coffee and tea, regular and decaf                             liquids that you cannot  ?Plain Jell-O any favor except red or purple                                           see through such as: ?Fruit ices (not with fruit pulp)                                     milk, soups, orange juice  ?Iced Popsicles                                    All solid food ?Carbonated beverages, regular and diet                                    ?Cranberry, grape and apple juices ?Sports drinks like Gatorade ? ?Sample Menu ?Breakfast                                Lunch  Supper ?Cranberry juice                                           ?Jell-O                                     Grape juice                           Apple juice ?Coffee or tea                        Jell-O                                       Popsicle ?                                               Coffee or tea                        Coffee or tea ? ?_____________________________________________________________________ ?  ? ? TAKE THESE MEDICATIONS MORNING OF SURGERY WITH A SIP OF WATER:  Flonase if needed ? ?ONE VISITOR IS ALLOWED IN WAITING ROOM ONLY DAY OF SURGERY.  YOU MAY HAVE ANOTHER PERSON SWITCH OUT WITH THE  1  VISITOR IN THE WAITING ROOM DAY OF SURGERY AND A MASK MUST BE WORN IN THE WAITING ROOM.  ? ? 2 VISITORS  MAY VISIT IN THE EXTENDED RECOVERY ROOM UNTIL 800 PM ONLY 1 VISITOR AGE 90 AND OVER MAY SPEND THE NIGHT AND MUST BE IN EXTENDED RECOVERY ROOM NO LATER THAN 800 PM .  ? ? UP TO 2 CHILDREN AGE 49 TO 15 MAY ALSO VISIT IN EXTENDED RECOV ?ERY ROOM ONLY UNTIL 800 PM AND MUST LEAVE BY 800 PM.  ? ?ALL PERSONS VISITING IN EXTENDED RECOVERY ROOM MUST WEAR A MASK. ?                                   ?DO NOT WEAR JEWERLY, MAKE UP. ?DO NOT WEAR LOTIONS, POWDERS, PERFUMES OR NAIL POLISH ON YOUR FINGERNAILS. TOENAIL POLISH IS OK TO WEAR. ?DO NOT SHAVE FOR 48 HOURS PRIOR TO DAY OF SURGERY. ?MEN MAY SHAVE FACE AND NECK. ?CONTACTS, GLASSES, OR DENTURES MAY NOT BE WORN TO SURGERY. ?                                   ?[facility] IS NOT RESPONSIBLE  FOR ANY BELONGINGS.                                  ?                                  . ?  [place] - Preparing for Surgery ?Before surgery, you can play an important role.  Because skin is not sterile, your skin needs to be as free of germs as possible.  You can reduce the number of germs on your skin by washing with CHG (chlorahexidine gluconate) soap before surgery.  CHG is an antiseptic cleaner which kills germs and bonds with the skin to continue killing germs even after washing. ?Please DO NOT use if you have an allergy to CHG or antibacterial soaps.  If your skin becomes reddened/irritated stop using the CHG and inform your nurse when you arrive at Short Stay. ?Do not shave (including  legs and underarms) for at least 48 hours prior to the first CHG shower.  You may shave your face/neck. ?Please follow these instructions carefully: ? 1.  Shower with CHG Soap the night before surgery and the  morning of Surgery. ? 2.  If you choose to wash your hair, wash your hair first as usual with your  normal  shampoo. ? 3.  After you shampoo, rinse your hair and body thoroughly to remove the  shampoo.                            ?4.  Use CHG as you would any other liquid soap.  You can apply chg directly  to the skin and wash  ?                    Gently with a scrungie or clean washcloth. ? 5.  Apply the CHG Soap to your body ONLY FROM THE NECK DOWN.   Do not use on face/ open      ?                     Wound or open sores. Avoid contact with eyes, ears mouth and genitals (private parts).  ?                     Production manager,  Genitals (private parts) with your normal soap. ?            6.  Wash thoroughly, paying special attention to the area where your surgery  will be performed. ? 7.  Thoroughly rinse your body with warm water from the neck down. ? 8.  DO NOT shower/wash with your normal soap after using and rinsing off  the CHG Soap. ?               9.  Pat yourself dry with a clean towel. ?           10.  Wear clean pajamas. ?           11.  Place clean sheets on your bed the night of your first shower and do not  sleep with pets. ?Day of Surgery : ?Do not apply any lotions/deodorants the morning of surgery.  Please wear clean clothes to the hospital/surgery center. ? ?IF YOU HAVE ANY SKIN IRRITATION OR PROBLEMS WITH THE SURGICAL SOAP, PLEASE GET A BAR OF GOLD DIAL SOAP AND SHOWER THE NIGHT BEFORE YOUR SURGERY AND THE MORNING OF YOUR SURGERY. PLEASE LET THE NURSE KNOW MORNING OF YOUR SURGERY IF YOU HAD ANY PROBLEMS WITH THE SURGICAL SOAP. ? ? ?________________________________________________________________________                  ?                                    ?  QUESTIONS CALL [facility] PRE OP NURSE  PHONE [phone].                                   ?

## 2021-05-09 ENCOUNTER — Other Ambulatory Visit: Payer: Self-pay

## 2021-05-09 ENCOUNTER — Encounter (HOSPITAL_COMMUNITY)
Admission: RE | Admit: 2021-05-09 | Discharge: 2021-05-09 | Disposition: A | Discharge status: Home / Self Care | Payer: BC Managed Care – PPO | Source: Ambulatory Visit | Attending: Obstetrics and Gynecology | Admitting: Obstetrics and Gynecology

## 2021-05-09 DIAGNOSIS — Z01818 Encounter for other preprocedural examination: Secondary | ICD-10-CM

## 2021-05-09 DIAGNOSIS — Z01812 Encounter for preprocedural laboratory examination: Secondary | ICD-10-CM | POA: Diagnosis not present

## 2021-05-09 LAB — BASIC METABOLIC PANEL
Anion gap: 6 (ref 5–15)
BUN: 15 mg/dL (ref 6–20)
CO2: 26 mmol/L (ref 22–32)
Calcium: 8.6 mg/dL — ABNORMAL LOW (ref 8.9–10.3)
Chloride: 102 mmol/L (ref 98–111)
Creatinine, Ser: 0.31 mg/dL — ABNORMAL LOW (ref 0.44–1.00)
GFR, Estimated: >60 mL/min (ref >60)
Glucose, Bld: 92 mg/dL (ref 70–99)
Potassium: 4.1 mmol/L (ref 3.5–5.1)
Sodium: 134 mmol/L — ABNORMAL LOW (ref 135–145)

## 2021-05-09 LAB — CBC
HCT: 34.9 % — ABNORMAL LOW (ref 36.0–46.0)
Hemoglobin: 11.8 g/dL — ABNORMAL LOW (ref 12.0–15.0)
MCH: 32.2 pg (ref 26.0–34.0)
MCHC: 33.8 g/dL (ref 30.0–36.0)
MCV: 95.4 fL (ref 80.0–100.0)
Platelets: 195 10*3/uL (ref 150–400)
RBC: 3.66 MIL/uL — ABNORMAL LOW (ref 3.87–5.11)
RDW: 11.9 % (ref 11.5–15.5)
WBC: 5.3 10*3/uL (ref 4.0–10.5)
nRBC: 0 % (ref 0.0–0.2)

## 2021-05-10 ENCOUNTER — Other Ambulatory Visit: Payer: Self-pay | Admitting: Obstetrics and Gynecology

## 2021-05-10 DIAGNOSIS — N950 Postmenopausal bleeding: Secondary | ICD-10-CM

## 2021-05-11 ENCOUNTER — Other Ambulatory Visit: Payer: Self-pay | Admitting: Obstetrics and Gynecology

## 2021-05-11 NOTE — H&P (Deleted)
  The note originally documented on this encounter has been moved the the encounter in which it belongs.  

## 2021-05-11 NOTE — H&P (Signed)
?Reason for Appointment  ? ?1. Preop  ?  ?  ? ?History of Present Illness  ?General:   ?       56 yo presents for a pre-op visit for robotic-assisted laparoscopic hysterectomy with bilateral salpingo-oophorectomy. ?       Her history is significant for h/o LCIS of the left breast with current tamoxifen therapy.  ?       she developed postmenopausal bleeding 10/11/2020. She had an endometrial biopsy that was unsatisfactory 12/14/2020. hysteroscopy D/C possible polypectomy with [facility] on Nov 15th,2022. Pathology revealed a benign endometrial polyp. Minute polyp begin. No malignancy or hyperplasia seen. During surgery, scar tissue was seen from her endometrial ablation in 2009. Full assessment of the endometrial cavity was not possible due to adhesions from previous ablation. she was advised Given Uterine adhesions if pt has further PMB Recommended hysterectomy. ?        she reports some spotting after sexual activity since her surgery 01/10/2021. Pelvic US 11/29/2020 notable for several small fibroids (2.4cm, 2.0cm, 1.8cm, 1.1cm) and mildly thickened endometrium (0.49cm). Pelvic ultrasound 04/11/2021 showed uterus measuring 10.1 cm x 5.5 cm x 6.0 cm. The endeomtrium is 1.38 cm with fluid. 4 fibroids are measured the largest is 2.4 cm and all fibroids are stable since previous ultrasound 11/29/2020. Ovaries are normal bilaterally.  ?  ? ?Current Medications  ?Taking  ?Flonase(Fluticasone Propionate) , Notes: as needed ?  ? ?Lisinopril 5 MG Tablet 1 tablet Orally Once a day ?  ? ?Colestipol HCl 1 GM Tablet 2 tablets Orally Once a day as needed, Notes: prn ?  ? ?Diclofenac Sodium 75 MG Tablet Delayed Release 1 tablet Orally Twice a day ?  ? ?Humira(Adalimumab) 40 MG/0.4ML Prefilled Syringe Kit 0.4 ml Subcutaneous every other week ?  ? ?Tamoxifen Citrate 20 MG Tablet 77m Orally three times a week, Notes: 1/4-1/2 tablet ?  ? ?ZyrTEC Allergy(Cetirizine HCl) 10 MG Tablet 1 tablet Orally Once a day as needed, Notes: prn ?   ?Discontinued  ?predniSONE 20 MG Tablet 2 tablets Orally *once a day for 5 days ?  ? ?Tessalon Perles(Benzonatate) 100 MG Capsule 2 capsules Orally Three times a day for cough ?  ? ?Azelastine HCl 137 MCG/SPRAY Solution 2 sprays in each nostril Nasally *Twice a day as needed, Notes: prn ?  ? ?Bentyl(Dicyclomine HCl) 20 MG Tablet 1 tablet as needed Orally Four times a day, Notes: prn ?  ? ?Plaquenil(Hydroxychloroquine Sulfate) 200 MG Tablet 2 tablets with food or milk Orally Once a day as needed ?  ? ?Medication List reviewed and reconciled with the patient  ?  ?  ?  ? ?Past Medical History  ? ?     Hypertension (04/2016). ?  ? ?     Celiac disease. ?  ? ?     Connective tissue disease: ANA 1:160, low pos dsDNA-then negative, C3, C4 normal, fatigue, arthralgias. periarticular osteopenia of hand joints, polyarthritis inflammatory of hands, shoulders. ?  ? ?     Irregular periods- resolved with ablation. ?  ? ?     Lobular carcinoma in situ of Left breast (06/2013) (annual mammo +/-MRI due to dense breast tissue). ?  ? ?     Adenomatous colon polyp. ?  ?  ?  ? ?Surgical History  ? ?      tubal ligation 2001  ? ?      wisdom teeth 1884  ? ?      ACL repair left 1992  ? ?  lymph node removal 2001  ? ?      Left ACL repair and menisectomy 08/2010  ? ?      colonoscopy 01/10/17  ? ?      endoscopy 01/10/17  ? ?      arthroscopy 2012  ? ?      lobular left removal/atypical lobular cancinoma in situ 2014  ? ?      Total right knee replacment   ? ?      Total Left knee replacement   ? ?      hysteroscopy D/C possible polypectomy 01/10/2021  ? ?      Endometrial ablation 2009  ?  ?  ? ?Family History  ? ?Father: deceased, diagnosed with Hypertension  ? ?Mother: deceased, diagnosed with Hypertension, CHF (congestive heart failure)  ? ?Maternal Grand Mother: deceased 1 yrs, MI  ? ?Brother 1: DM, ESRD, CVA  ? ?1 brother(s) , 1 sister(s) . 2 daughter(s) .   ? ?Maternal cousins with Breast Cancer- no other GYN family cancer. No  autoimmune disease\\nneg family hx of colon cancer\\\/polyps or liver disease.  ?  ?  ? ?Social History  ?General:   ?Tobacco use  cigarettes: Never smoked, Tobacco history last updated 04/26/2021. no EXPOSURE TO PASSIVE SMOKE. Alcohol: yes, weekends, social. Caffeine: yes, 2 serving daily, coffee, tea, soda, very little. no Recreational drug use. DIET: gluten free. Exercise: tennis, regularly, exercises regularly. Marital Status: married. Children: girls, 2. OCCUPATION: teacher- preschool.   ?  ? ?Gyn History  ?Sexual activity currently sexually active.   ?Periods : irregular.   ?LMP 2022, spotting.   ?Birth control BTL.   ?Last pap smear date 11/17/2020 NILM, HRHPV neg.   ?Last mammogram date 11/23/2020.   ?Denies H/O Abnormal pap smear.   ?Denies H/O STD.   ?Menarche 12.   ?Other: Colonoscopy 01-10-2017 Tubular adenoma, repeat in 5 years.    ?  ? ?OB History  ?miscarriages 1 - no D & C.   ?Pregnancy # 1 miscarriage, no D & C.   ?Pregnancy # 2 live birth, vaginal delivery, girl--09/05/1994, 6+10, 38 wks.   ?Pregnancy # 3 live birth, vaginal delivery, girl, 04/18/1998, 6+3, 39 wks.    ?  ? ?Allergies  ? ?Sulfa Drugs: Rash (02/2015) - Allergy  ?  ?  ? ?Hospitalization/Major Diagnostic Procedure  ? ?see surgery   ? ?childbirth x 2 vaginal   ?  ?  ? ?Review of Systems  ?CONSTITUTIONAL:   ?      Chills No. Fatigue No. Fever No. Night sweats yes. Recent travel outside [place] No. Sweats No. Weight change No.     ?OPHTHALMOLOGY:   ?      Blurring of vision no. Change in vision no. Double vision no.     ?ENT:   ?      Dizziness no. Nose bleeds no. Sore throat no. Teeth pain no.     ?ALLERGY:   ?      Hives no.     ?CARDIOLOGY:   ?      Chest pain no. High blood pressure no. Irregular heart beat no. Leg edema no. Palpitations no.     ?RESPIRATORY:   ?      Shortness of breath no. Cough no. Wheezing no.     ?UROLOGY:   ?      Pain with urination no. Urinary urgency no. Urinary frequency no. Urinary incontinence no. Difficulty  urinating No. Blood in urine No.     ?  GASTROENTEROLOGY:   ?      Abdominal pain no. Appetite change no. Bloating/belching yes. Blood in stool or on toilet paper no. Change in bowel movements no. Constipation no. Diarrhea yes. Difficulty swallowing no. Nausea no.     ?FEMALE REPRODUCTIVE:   ?      Vulvar pain no. Vulvar rash no. Abnormal vaginal bleeding  yes. Breast pain no. Nipple discharge no. Pain with intercourse no. Pelvic pain no. Unusual vaginal discharge no. Vaginal itching no.     ?MUSCULOSKELETAL:   ?      Muscle aches no.     ?NEUROLOGY:   ?      Headache no. Tingling/numbness no. Weakness no.     ?PSYCHOLOGY:   ?      Depression no. Anxiety no. Nervousness no. Sleep disturbances no. Suicidal ideation no .     ?ENDOCRINOLOGY:   ?      Excessive thirst no. Excessive urination no. Hair loss no. Heat or cold intolerance no.     ?HEMATOLOGY/LYMPH:   ?      Abnormal bleeding no. Easy bruising no. Swollen glands no.     ?DERMATOLOGY:   ?      New/changing skin lesion no. Rash no. Sores no.    ?  ? ?Vital Signs  ?Wt 122, Wt change 1 lbs, Ht 60.5, BMI 23.43, Pulse sitting 65, BP sitting 118/68.  ?  ? ?Examination  ?General Examination: ?      CONSTITUTIONAL: alert, oriented, NAD. SKIN:  moist, warm. EYES:  Conjunctiva clear. LUNGS: good I:E efffort noted , clear to auscultation bilaterally. HEART: heart sounds are normal, rhythm is regular, no murmur. ABDOMEN: soft, non-tender/non-distended, bowel sounds present. FEMALE GENITOURINARY: normal external genitalia, labia - unremarkable, vagina - pink moist mucosa, no lesions or abnormal discharge, cervix - no discharge or lesions or CMT, adnexa - no masses or tenderness, uterus - nontender and normal size on palpation. PSYCH:  affect normal, good eye contact.  ?  ?  ? ?Physical Examination  ?Chaperone present:   ?      Chaperone present  Beather Arbour 04/26/2021 04:41:25 PM > , for pelvic exam.   ?      ?  ? ?Assessments  ?  ? ?1. PMB (postmenopausal bleeding) - N95.0  (Primary)  ? ?2. Thickened endometrium - R93.89  ? ?3. History of tamoxifen therapy - Z92.29  ? ?4. History of endometrial ablation - Z98.890  ?  ? ?Treatment  ? ?1. PMB (postmenopausal bleeding)   ?Notes:  ?P

## 2021-05-12 ENCOUNTER — Ambulatory Visit (HOSPITAL_BASED_OUTPATIENT_CLINIC_OR_DEPARTMENT_OTHER): Payer: BC Managed Care – PPO | Admitting: Anesthesiology

## 2021-05-12 ENCOUNTER — Encounter (HOSPITAL_BASED_OUTPATIENT_CLINIC_OR_DEPARTMENT_OTHER)
Admission: RE | Disposition: A | Discharge status: Home / Self Care | Payer: Self-pay | Source: Home / Self Care | Attending: Obstetrics and Gynecology

## 2021-05-12 ENCOUNTER — Observation Stay (HOSPITAL_BASED_OUTPATIENT_CLINIC_OR_DEPARTMENT_OTHER)
Admission: RE | Admit: 2021-05-12 | Discharge: 2021-05-12 | Disposition: A | Discharge status: Home / Self Care | Payer: BC Managed Care – PPO | Attending: Obstetrics and Gynecology | Admitting: Obstetrics and Gynecology

## 2021-05-12 ENCOUNTER — Encounter (HOSPITAL_BASED_OUTPATIENT_CLINIC_OR_DEPARTMENT_OTHER): Payer: Self-pay | Admitting: Obstetrics and Gynecology

## 2021-05-12 ENCOUNTER — Other Ambulatory Visit: Payer: Self-pay

## 2021-05-12 DIAGNOSIS — Z20822 Contact with and (suspected) exposure to covid-19: Secondary | ICD-10-CM | POA: Insufficient documentation

## 2021-05-12 DIAGNOSIS — N950 Postmenopausal bleeding: Principal | ICD-10-CM | POA: Diagnosis present

## 2021-05-12 DIAGNOSIS — N888 Other specified noninflammatory disorders of cervix uteri: Secondary | ICD-10-CM | POA: Insufficient documentation

## 2021-05-12 DIAGNOSIS — D259 Leiomyoma of uterus, unspecified: Secondary | ICD-10-CM | POA: Insufficient documentation

## 2021-05-12 DIAGNOSIS — N838 Other noninflammatory disorders of ovary, fallopian tube and broad ligament: Secondary | ICD-10-CM | POA: Insufficient documentation

## 2021-05-12 DIAGNOSIS — I10 Essential (primary) hypertension: Secondary | ICD-10-CM | POA: Insufficient documentation

## 2021-05-12 DIAGNOSIS — N8312 Corpus luteum cyst of left ovary: Secondary | ICD-10-CM | POA: Diagnosis not present

## 2021-05-12 DIAGNOSIS — N8003 Adenomyosis of the uterus: Secondary | ICD-10-CM | POA: Diagnosis not present

## 2021-05-12 DIAGNOSIS — D251 Intramural leiomyoma of uterus: Secondary | ICD-10-CM | POA: Diagnosis not present

## 2021-05-12 DIAGNOSIS — Z90710 Acquired absence of both cervix and uterus: Secondary | ICD-10-CM | POA: Diagnosis present

## 2021-05-12 DIAGNOSIS — N8501 Benign endometrial hyperplasia: Secondary | ICD-10-CM | POA: Diagnosis not present

## 2021-05-12 DIAGNOSIS — N8311 Corpus luteum cyst of right ovary: Secondary | ICD-10-CM | POA: Diagnosis not present

## 2021-05-12 DIAGNOSIS — R9389 Abnormal findings on diagnostic imaging of other specified body structures: Secondary | ICD-10-CM | POA: Diagnosis not present

## 2021-05-12 DIAGNOSIS — Z01818 Encounter for other preprocedural examination: Secondary | ICD-10-CM

## 2021-05-12 DIAGNOSIS — Z96653 Presence of artificial knee joint, bilateral: Secondary | ICD-10-CM | POA: Insufficient documentation

## 2021-05-12 HISTORY — PX: ROBOTIC ASSISTED TOTAL HYSTERECTOMY WITH BILATERAL SALPINGO OOPHERECTOMY: SHX6086

## 2021-05-12 HISTORY — DX: Personal history of colonic polyps: Z86.010

## 2021-05-12 HISTORY — DX: Personal history of adenomatous and serrated colon polyps: Z86.0101

## 2021-05-12 HISTORY — DX: Essential (primary) hypertension: I10

## 2021-05-12 HISTORY — DX: Sensorineural hearing loss, bilateral: H90.3

## 2021-05-12 LAB — ABO/RH: ABO/RH(D): O NEG

## 2021-05-12 LAB — TYPE AND SCREEN
ABO/RH(D): O NEG
Antibody Screen: NEGATIVE

## 2021-05-12 LAB — POCT PREGNANCY, URINE: Preg Test, Ur: NEGATIVE

## 2021-05-12 SURGERY — HYSTERECTOMY, TOTAL, ROBOT-ASSISTED, LAPAROSCOPIC, WITH BILATERAL SALPINGO-OOPHORECTOMY
Anesthesia: General | Site: Abdomen | Laterality: Bilateral

## 2021-05-12 MED ORDER — OXYCODONE HCL 5 MG PO TABS
ORAL_TABLET | ORAL | Status: AC
  Filled 2021-05-12: qty 1

## 2021-05-12 MED ORDER — STERILE WATER FOR IRRIGATION IR SOLN
Status: DC | PRN
  Administered 2021-05-12: 500 mL

## 2021-05-12 MED ORDER — POVIDONE-IODINE 10 % EX SWAB: 2.0000 "application " | Freq: Once | CUTANEOUS | Status: DC

## 2021-05-12 MED ORDER — LACTATED RINGERS IV SOLN: INTRAVENOUS | Status: DC

## 2021-05-12 MED ORDER — SODIUM CHLORIDE 0.9 % IV SOLN
2.0000 g | INTRAVENOUS | Status: AC
  Administered 2021-05-12: 2 g via INTRAVENOUS

## 2021-05-12 MED ORDER — LACTATED RINGERS IV BOLUS
250.0000 mL | Freq: Once | INTRAVENOUS | Status: AC
  Administered 2021-05-12: 250 mL via INTRAVENOUS

## 2021-05-12 MED ORDER — GABAPENTIN 300 MG PO CAPS
300.0000 mg | ORAL_CAPSULE | Freq: Two times a day (BID) | ORAL | Status: DC
Start: 2021-05-12 — End: 2021-05-13

## 2021-05-12 MED ORDER — ROCURONIUM BROMIDE 10 MG/ML (PF) SYRINGE
PREFILLED_SYRINGE | INTRAVENOUS | Status: AC
  Filled 2021-05-12: qty 20

## 2021-05-12 MED ORDER — FENTANYL CITRATE (PF) 250 MCG/5ML IJ SOLN
INTRAMUSCULAR | Status: AC
  Filled 2021-05-12: qty 5

## 2021-05-12 MED ORDER — LORATADINE 10 MG PO TABS: 10.0000 mg | ORAL_TABLET | Freq: Every day | ORAL | Status: DC | PRN

## 2021-05-12 MED ORDER — OXYCODONE HCL 5 MG PO TABS: 5.0000 mg | ORAL_TABLET | Freq: Once | ORAL | Status: DC | PRN

## 2021-05-12 MED ORDER — EPHEDRINE SULFATE-NACL 50-0.9 MG/10ML-% IV SOSY
PREFILLED_SYRINGE | INTRAVENOUS | Status: DC | PRN
  Administered 2021-05-12: 50 mg via INTRAVENOUS

## 2021-05-12 MED ORDER — OXYCODONE HCL 5 MG PO TABS
5.0000 mg | ORAL_TABLET | ORAL | Status: DC | PRN
  Administered 2021-05-12 (×2): 5 mg via ORAL

## 2021-05-12 MED ORDER — LIDOCAINE HCL (CARDIAC) PF 100 MG/5ML IV SOSY
PREFILLED_SYRINGE | INTRAVENOUS | Status: DC | PRN
  Administered 2021-05-12: 50 mg via INTRAVENOUS

## 2021-05-12 MED ORDER — KETAMINE HCL 50 MG/5ML IJ SOSY
PREFILLED_SYRINGE | INTRAMUSCULAR | Status: AC
  Filled 2021-05-12: qty 5

## 2021-05-12 MED ORDER — KETAMINE HCL 10 MG/ML IJ SOLN
INTRAMUSCULAR | Status: DC | PRN
  Administered 2021-05-12: 25 mg via INTRAVENOUS

## 2021-05-12 MED ORDER — ONDANSETRON HCL 4 MG/2ML IJ SOLN: 4.0000 mg | Freq: Once | INTRAMUSCULAR | Status: DC | PRN

## 2021-05-12 MED ORDER — GABAPENTIN 300 MG PO CAPS
ORAL_CAPSULE | ORAL | Status: AC
  Filled 2021-05-12: qty 1

## 2021-05-12 MED ORDER — SENNA 8.6 MG PO TABS
ORAL_TABLET | ORAL | Status: AC
  Filled 2021-05-12: qty 1

## 2021-05-12 MED ORDER — SODIUM CHLORIDE 0.9 % IV SOLN
INTRAVENOUS | Status: DC | PRN
  Administered 2021-05-12: 120 mL

## 2021-05-12 MED ORDER — ACETAMINOPHEN 500 MG PO TABS
1000.0000 mg | ORAL_TABLET | Freq: Four times a day (QID) | ORAL | Status: DC
  Administered 2021-05-12: 1000 mg via ORAL

## 2021-05-12 MED ORDER — ACETAMINOPHEN 500 MG PO TABS: 1000.0000 mg | ORAL_TABLET | Freq: Once | ORAL | Status: DC

## 2021-05-12 MED ORDER — GLYCOPYRROLATE 0.2 MG/ML IJ SOLN
INTRAMUSCULAR | Status: DC | PRN
  Administered 2021-05-12: .1 mg via INTRAVENOUS

## 2021-05-12 MED ORDER — TAMOXIFEN CITRATE 10 MG PO TABS
10.0000 mg | ORAL_TABLET | Freq: Two times a day (BID) | ORAL | Status: DC
  Filled 2021-05-12: qty 1

## 2021-05-12 MED ORDER — SENNA 8.6 MG PO TABS
1.0000 | ORAL_TABLET | Freq: Two times a day (BID) | ORAL | Status: DC
  Administered 2021-05-12: 8.6 mg via ORAL

## 2021-05-12 MED ORDER — PROPOFOL 10 MG/ML IV BOLUS
INTRAVENOUS | Status: DC | PRN
  Administered 2021-05-12: 20 mg via INTRAVENOUS
  Administered 2021-05-12: 130 mg via INTRAVENOUS

## 2021-05-12 MED ORDER — SCOPOLAMINE 1 MG/3DAYS TD PT72
MEDICATED_PATCH | TRANSDERMAL | Status: AC
  Filled 2021-05-12: qty 1

## 2021-05-12 MED ORDER — OXYCODONE HCL 5 MG PO TABS: 5.0000 mg | ORAL_TABLET | ORAL | 0 refills | Status: DC | PRN

## 2021-05-12 MED ORDER — SIMETHICONE 80 MG PO CHEW: 80.0000 mg | CHEWABLE_TABLET | Freq: Four times a day (QID) | ORAL | Status: DC | PRN

## 2021-05-12 MED ORDER — PHENYLEPHRINE HCL (PRESSORS) 10 MG/ML IV SOLN
INTRAVENOUS | Status: DC | PRN
  Administered 2021-05-12 (×2): 80 ug via INTRAVENOUS
  Administered 2021-05-12: 40 ug via INTRAVENOUS
  Administered 2021-05-12: 120 ug via INTRAVENOUS
  Administered 2021-05-12 (×2): 80 ug via INTRAVENOUS

## 2021-05-12 MED ORDER — GABAPENTIN 300 MG PO CAPS
300.0000 mg | ORAL_CAPSULE | ORAL | Status: AC
  Administered 2021-05-12: 300 mg via ORAL

## 2021-05-12 MED ORDER — HYDROMORPHONE HCL 1 MG/ML IJ SOLN
0.2500 mg | INTRAMUSCULAR | Status: DC | PRN
  Administered 2021-05-12: 0.25 mg via INTRAVENOUS
  Administered 2021-05-12: 0.5 mg via INTRAVENOUS

## 2021-05-12 MED ORDER — ROCURONIUM BROMIDE 100 MG/10ML IV SOLN
INTRAVENOUS | Status: DC | PRN
  Administered 2021-05-12: 50 mg via INTRAVENOUS
  Administered 2021-05-12 (×2): 20 mg via INTRAVENOUS
  Administered 2021-05-12: 10 mg via INTRAVENOUS

## 2021-05-12 MED ORDER — EPHEDRINE 5 MG/ML INJ
INTRAVENOUS | Status: AC
  Filled 2021-05-12: qty 5

## 2021-05-12 MED ORDER — ACETAMINOPHEN 500 MG PO TABS
ORAL_TABLET | ORAL | Status: AC
  Filled 2021-05-12: qty 2

## 2021-05-12 MED ORDER — PANTOPRAZOLE SODIUM 40 MG PO TBEC: 40.0000 mg | DELAYED_RELEASE_TABLET | Freq: Every day | ORAL | Status: DC

## 2021-05-12 MED ORDER — MIDAZOLAM HCL 2 MG/2ML IJ SOLN
INTRAMUSCULAR | Status: AC
  Filled 2021-05-12: qty 2

## 2021-05-12 MED ORDER — HEMOSTATIC AGENTS (NO CHARGE) OPTIME
TOPICAL | Status: DC | PRN
  Administered 2021-05-12: 1 via TOPICAL

## 2021-05-12 MED ORDER — ONDANSETRON HCL 4 MG/2ML IJ SOLN: 4.0000 mg | Freq: Four times a day (QID) | INTRAMUSCULAR | Status: DC | PRN

## 2021-05-12 MED ORDER — KETOROLAC TROMETHAMINE 30 MG/ML IJ SOLN
INTRAMUSCULAR | Status: DC | PRN
  Administered 2021-05-12: 30 mg via INTRAVENOUS

## 2021-05-12 MED ORDER — AMISULPRIDE (ANTIEMETIC) 5 MG/2ML IV SOLN: 10.0000 mg | Freq: Once | INTRAVENOUS | Status: DC | PRN

## 2021-05-12 MED ORDER — SILVER NITRATE-POT NITRATE 75-25 % EX MISC
CUTANEOUS | Status: DC | PRN
  Administered 2021-05-12: 2

## 2021-05-12 MED ORDER — KETOROLAC TROMETHAMINE 30 MG/ML IJ SOLN: 30.0000 mg | Freq: Once | INTRAMUSCULAR | Status: DC | PRN

## 2021-05-12 MED ORDER — ONDANSETRON HCL 4 MG/2ML IJ SOLN
INTRAMUSCULAR | Status: DC | PRN
  Administered 2021-05-12: 4 mg via INTRAVENOUS

## 2021-05-12 MED ORDER — SUGAMMADEX SODIUM 200 MG/2ML IV SOLN
INTRAVENOUS | Status: DC | PRN
  Administered 2021-05-12: 200 mg via INTRAVENOUS

## 2021-05-12 MED ORDER — ALUM & MAG HYDROXIDE-SIMETH 200-200-20 MG/5ML PO SUSP: 30.0000 mL | ORAL | Status: DC | PRN

## 2021-05-12 MED ORDER — DEXAMETHASONE SODIUM PHOSPHATE 4 MG/ML IJ SOLN
INTRAMUSCULAR | Status: DC | PRN
  Administered 2021-05-12: 8 mg via INTRAVENOUS

## 2021-05-12 MED ORDER — PHENYLEPHRINE 40 MCG/ML (10ML) SYRINGE FOR IV PUSH (FOR BLOOD PRESSURE SUPPORT)
PREFILLED_SYRINGE | INTRAVENOUS | Status: AC
  Filled 2021-05-12: qty 50

## 2021-05-12 MED ORDER — EPHEDRINE SULFATE (PRESSORS) 50 MG/ML IJ SOLN
INTRAMUSCULAR | Status: DC | PRN
  Administered 2021-05-12 (×2): 5 mg via INTRAVENOUS

## 2021-05-12 MED ORDER — FENTANYL CITRATE (PF) 100 MCG/2ML IJ SOLN
INTRAMUSCULAR | Status: DC | PRN
  Administered 2021-05-12: 50 ug via INTRAVENOUS

## 2021-05-12 MED ORDER — SODIUM CHLORIDE 0.9 % IV SOLN
INTRAVENOUS | Status: AC
  Filled 2021-05-12: qty 2

## 2021-05-12 MED ORDER — SODIUM CHLORIDE 0.9 % IR SOLN
Status: DC | PRN
  Administered 2021-05-12: 300 mL

## 2021-05-12 MED ORDER — MIDAZOLAM HCL 2 MG/2ML IJ SOLN
INTRAMUSCULAR | Status: DC | PRN
  Administered 2021-05-12: 2 mg via INTRAVENOUS

## 2021-05-12 MED ORDER — OXYCODONE HCL 5 MG/5ML PO SOLN: 5.0000 mg | Freq: Once | ORAL | Status: DC | PRN

## 2021-05-12 MED ORDER — LACTATED RINGERS IV BOLUS
500.0000 mL | Freq: Once | INTRAVENOUS | Status: AC
  Administered 2021-05-12: 500 mL via INTRAVENOUS

## 2021-05-12 MED ORDER — IBUPROFEN 200 MG PO TABS
600.0000 mg | ORAL_TABLET | Freq: Four times a day (QID) | ORAL | Status: DC
Start: 2021-05-12 — End: 2021-05-13

## 2021-05-12 MED ORDER — HYDROMORPHONE HCL 1 MG/ML IJ SOLN: 0.2000 mg | INTRAMUSCULAR | Status: DC | PRN

## 2021-05-12 MED ORDER — ONDANSETRON HCL 4 MG PO TABS: 4.0000 mg | ORAL_TABLET | Freq: Four times a day (QID) | ORAL | Status: DC | PRN

## 2021-05-12 MED ORDER — HYDROMORPHONE HCL 1 MG/ML IJ SOLN
INTRAMUSCULAR | Status: AC
  Filled 2021-05-12: qty 1

## 2021-05-12 MED ORDER — ZOLPIDEM TARTRATE 5 MG PO TABS: 5.0000 mg | ORAL_TABLET | Freq: Every evening | ORAL | Status: DC | PRN

## 2021-05-12 MED ORDER — SCOPOLAMINE 1 MG/3DAYS TD PT72
1.0000 | MEDICATED_PATCH | TRANSDERMAL | Status: DC
  Administered 2021-05-12: 1.5 mg via TRANSDERMAL

## 2021-05-12 MED ORDER — ACETAMINOPHEN 500 MG PO TABS
1000.0000 mg | ORAL_TABLET | ORAL | Status: AC
Start: 2021-05-12 — End: 2021-05-12
  Administered 2021-05-12: 1000 mg via ORAL

## 2021-05-12 MED ORDER — MENTHOL 3 MG MT LOZG: 1.0000 | LOZENGE | OROMUCOSAL | Status: DC | PRN

## 2021-05-12 MED ORDER — IBUPROFEN 600 MG PO TABS
600.0000 mg | ORAL_TABLET | Freq: Four times a day (QID) | ORAL | 0 refills | Status: AC | PRN
Start: 2021-05-12 — End: ?

## 2021-05-12 SURGICAL SUPPLY — 72 items
ADH SKN CLS APL DERMABOND .7 (GAUZE/BANDAGES/DRESSINGS) ×1
APL SRG 38 LTWT LNG FL B (MISCELLANEOUS) ×1
APPLICATOR ARISTA FLEXITIP XL (MISCELLANEOUS) ×1 IMPLANT
BARRIER ADHS 3X4 INTERCEED (GAUZE/BANDAGES/DRESSINGS) IMPLANT
BRR ADH 4X3 ABS CNTRL BYND (GAUZE/BANDAGES/DRESSINGS)
CATH FOLEY 3WAY  5CC 16FR (CATHETERS) ×2
CATH FOLEY 3WAY 5CC 16FR (CATHETERS) ×1 IMPLANT
CELLS DAT CNTRL 66122 CELL SVR (MISCELLANEOUS) IMPLANT
COVER BACK TABLE 60X90IN (DRAPES) ×2 IMPLANT
COVER TIP SHEARS 8 DVNC (MISCELLANEOUS) ×1 IMPLANT
COVER TIP SHEARS 8MM DA VINCI (MISCELLANEOUS) ×2
DECANTER SPIKE VIAL GLASS SM (MISCELLANEOUS) ×2 IMPLANT
DEFOGGER SCOPE WARMER CLEARIFY (MISCELLANEOUS) ×2 IMPLANT
DERMABOND ADVANCED (GAUZE/BANDAGES/DRESSINGS) ×1
DERMABOND ADVANCED .7 DNX12 (GAUZE/BANDAGES/DRESSINGS) ×1 IMPLANT
DILATOR CANAL MILEX (MISCELLANEOUS) ×2 IMPLANT
DRAPE ARM DVNC X/XI (DISPOSABLE) ×4 IMPLANT
DRAPE COLUMN DVNC XI (DISPOSABLE) ×1 IMPLANT
DRAPE DA VINCI XI ARM (DISPOSABLE) ×8
DRAPE DA VINCI XI COLUMN (DISPOSABLE) ×2
DRAPE UTILITY 15X26 TOWEL STRL (DRAPES) ×2 IMPLANT
DRAPE UTILITY XL STRL (DRAPES) ×1 IMPLANT
DURAPREP 26ML APPLICATOR (WOUND CARE) ×1 IMPLANT
ELECT REM PT RETURN 9FT ADLT (ELECTROSURGICAL) ×2
ELECTRODE REM PT RTRN 9FT ADLT (ELECTROSURGICAL) ×1 IMPLANT
GAUZE 4X4 16PLY ~~LOC~~+RFID DBL (SPONGE) ×4 IMPLANT
GLOVE SURG ENC TEXT LTX SZ6.5 (GLOVE) ×6 IMPLANT
GLOVE SURG UNDER POLY LF SZ6.5 (GLOVE) ×12 IMPLANT
HEMOSTAT ARISTA ABSORB 3G PWDR (HEMOSTASIS) ×1 IMPLANT
HOLDER FOLEY CATH W/STRAP (MISCELLANEOUS) IMPLANT
IRRIG SUCT STRYKERFLOW 2 WTIP (MISCELLANEOUS) ×2
IRRIGATION SUCT STRKRFLW 2 WTP (MISCELLANEOUS) ×1 IMPLANT
KIT TURNOVER CYSTO (KITS) ×2 IMPLANT
LEGGING LITHOTOMY PAIR STRL (DRAPES) ×2 IMPLANT
MANIFOLD NEPTUNE II (INSTRUMENTS) ×2 IMPLANT
OBTURATOR OPTICAL STANDARD 8MM (TROCAR) ×2
OBTURATOR OPTICAL STND 8 DVNC (TROCAR) ×1
OBTURATOR OPTICALSTD 8 DVNC (TROCAR) ×1 IMPLANT
OCCLUDER COLPOPNEUMO (BALLOONS) ×2 IMPLANT
PACK ROBOT WH (CUSTOM PROCEDURE TRAY) ×2 IMPLANT
PACK ROBOTIC GOWN (GOWN DISPOSABLE) ×2 IMPLANT
PACK TRENDGUARD 450 HYBRID PRO (MISCELLANEOUS) IMPLANT
PAD OB MATERNITY 4.3X12.25 (PERSONAL CARE ITEMS) ×2 IMPLANT
PAD PREP 24X48 CUFFED NSTRL (MISCELLANEOUS) ×2 IMPLANT
PROTECTOR NERVE ULNAR (MISCELLANEOUS) ×1 IMPLANT
RETRACTOR WND ALEXIS 18 MED (MISCELLANEOUS) IMPLANT
RTRCTR WOUND ALEXIS 18CM MED (MISCELLANEOUS)
RTRCTR WOUND ALEXIS 18CM SML (INSTRUMENTS)
SAVER CELL AAL HAEMONETICS (INSTRUMENTS) IMPLANT
SCISSORS LAP 5X45 EPIX DISP (ENDOMECHANICALS) IMPLANT
SEAL CANN UNIV 5-8 DVNC XI (MISCELLANEOUS) ×4 IMPLANT
SEAL XI 5MM-8MM UNIVERSAL (MISCELLANEOUS) ×8
SEALER VESSEL DA VINCI XI (MISCELLANEOUS) ×2
SEALER VESSEL EXT DVNC XI (MISCELLANEOUS) IMPLANT
SET IRRIG Y TYPE TUR BLADDER L (SET/KITS/TRAYS/PACK) IMPLANT
SET TRI-LUMEN FLTR TB AIRSEAL (TUBING) ×2 IMPLANT
SPONGE T-LAP 4X18 ~~LOC~~+RFID (SPONGE) ×1 IMPLANT
SUT VIC AB 0 CT1 27 (SUTURE) ×4
SUT VIC AB 0 CT1 27XBRD ANBCTR (SUTURE) ×2 IMPLANT
SUT VICRYL 0 UR6 27IN ABS (SUTURE) IMPLANT
SUT VICRYL RAPIDE 4/0 PS 2 (SUTURE) ×5 IMPLANT
SUT VLOC 180 0 9IN  GS21 (SUTURE) ×2
SUT VLOC 180 0 9IN GS21 (SUTURE) ×1 IMPLANT
TIP RUMI ORANGE 6.7MMX12CM (TIP) IMPLANT
TIP UTERINE 5.1X6CM LAV DISP (MISCELLANEOUS) IMPLANT
TIP UTERINE 6.7X10CM GRN DISP (MISCELLANEOUS) IMPLANT
TIP UTERINE 6.7X6CM WHT DISP (MISCELLANEOUS) IMPLANT
TIP UTERINE 6.7X8CM BLUE DISP (MISCELLANEOUS) ×1 IMPLANT
TOWEL OR 17X26 10 PK STRL BLUE (TOWEL DISPOSABLE) ×2 IMPLANT
TRENDGUARD 450 HYBRID PRO PACK (MISCELLANEOUS) ×2
TROCAR PORT AIRSEAL 8X120 (TROCAR) ×2 IMPLANT
WATER STERILE IRR 1000ML POUR (IV SOLUTION) ×2 IMPLANT

## 2021-05-12 NOTE — Anesthesia Preprocedure Evaluation (Addendum)
Anesthesia Evaluation  ?Patient identified by MRN, date of birth, ID band ?Patient awake ? ? ? ?Reviewed: ?Allergy & Precautions, NPO status , Patient's Chart, lab work & pertinent test results ? ?History of Anesthesia Complications ?(+) PONV and history of anesthetic complications (mild nausea w/ pain meds, usually not right after anesthesia) ? ?Airway ?Mallampati: II ? ?TM Distance: >3 FB ?Neck ROM: Full ? ? ? Dental ?no notable dental hx. ?(+) Teeth Intact, Dental Advisory Given ?  ?Pulmonary ?neg pulmonary ROS,  ?  ?Pulmonary exam normal ?breath sounds clear to auscultation ? ? ? ? ? ? Cardiovascular ?hypertension, Pt. on medications ?Normal cardiovascular exam ?Rhythm:Regular Rate:Normal ? ? ?  ?Neuro/Psych ?negative neurological ROS ? negative psych ROS  ? GI/Hepatic ?Neg liver ROS, celiac ?  ?Endo/Other  ?negative endocrine ROS ? Renal/GU ?negative Renal ROS  ? ?  ?Musculoskeletal ? ?(+) Arthritis , Rheumatoid disorders,   ? Abdominal ?  ?Peds ? Hematology ? ?(+) Blood dyscrasia, anemia , Hb 11.8, plt 195   ?Anesthesia Other Findings ?Hx breast ca 2015 ?HOH ? Reproductive/Obstetrics ?PMB, hx tamoxifen, thick endometrium  ? ?  ? ? ? ? ? ? ? ? ? ? ? ? ? ?  ?  ? ? ? ? ? ? ? ?Anesthesia Physical ?Anesthesia Plan ? ?ASA: 2 ? ?Anesthesia Plan: General  ? ?Post-op Pain Management: Tylenol PO (pre-op)*, Toradol IV (intra-op)*, Dilaudid IV and Ketamine IV*  ? ?Induction: Intravenous ? ?PONV Risk Score and Plan: 4 or greater and Ondansetron, Dexamethasone, Midazolam, Scopolamine patch - Pre-op and Treatment may vary due to age or medical condition ? ?Airway Management Planned: Oral ETT ? ?Additional Equipment: None ? ?Intra-op Plan:  ? ?Post-operative Plan: Extubation in OR ? ?Informed Consent: I have reviewed the patients History and Physical, chart, labs and discussed the procedure including the risks, benefits and alternatives for the proposed anesthesia with the patient or  authorized representative who has indicated his/her understanding and acceptance.  ? ? ? ?Dental advisory given ? ?Plan Discussed with: CRNA ? ?Anesthesia Plan Comments: (D+E 12/2020- zofran, decadron, midaz- doesn't recall any nausea that time )  ? ? ? ? ? ?Anesthesia Quick Evaluation ? ?

## 2021-05-12 NOTE — Anesthesia Postprocedure Evaluation (Signed)
Anesthesia Post Note ? ?Patient: Wendy Waller ? ?Procedure(s) Performed: XI ROBOTIC ASSISTED TOTAL HYSTERECTOMY WITH BILATERAL SALPINGO OOPHORECTOMY (Bilateral: Abdomen) ? ?  ? ?Patient location during evaluation: PACU ?Anesthesia Type: General ?Level of consciousness: awake and alert, oriented and patient cooperative ?Pain management: pain level controlled ?Vital Signs Assessment: post-procedure vital signs reviewed and stable ?Respiratory status: spontaneous breathing, nonlabored ventilation and respiratory function stable ?Cardiovascular status: blood pressure returned to baseline and stable ?Postop Assessment: no apparent nausea or vomiting ?Anesthetic complications: no ? ? ?No notable events documented. ? ?Last Vitals:  ?Vitals:  ? 05/12/21 1200 05/12/21 1215  ?BP: 113/68 104/66  ?Pulse: 65 82  ?Resp: 15 18  ?Temp: (!) 36.4 ?C   ?SpO2: 100% 100%  ?  ?Last Pain:  ?Vitals:  ? 05/12/21 1215  ?TempSrc:   ?PainSc: 4   ? ? ?  ?  ?  ?  ?  ?  ? ?Jarome Matin Brienne Liguori ? ? ? ? ?

## 2021-05-12 NOTE — Transfer of Care (Signed)
Immediate Anesthesia Transfer of Care Note ? ?Patient: Wendy Waller ? ?Procedure(s) Performed: XI ROBOTIC ASSISTED TOTAL HYSTERECTOMY WITH BILATERAL SALPINGO OOPHORECTOMY (Bilateral: Abdomen) ? ?Patient Location: PACU ? ?Anesthesia Type:General ? ?Level of Consciousness: awake, alert  and patient cooperative ? ?Airway & Oxygen Therapy: Patient Spontanous Breathing and Patient connected to nasal cannula oxygen ? ?Post-op Assessment: Report given to RN and Post -op Vital signs reviewed and stable ? ?Post vital signs: Reviewed and stable ? ?Last Vitals:  ?Vitals Value Taken Time  ?BP    ?Temp    ?Pulse 75 05/12/21 1129  ?Resp    ?SpO2 98 % 05/12/21 1129  ?Vitals shown include unvalidated device data. ? ?Last Pain:  ?Vitals:  ? 05/12/21 0824  ?TempSrc: Oral  ?PainSc: 0-No pain  ?   ? ?Patients Stated Pain Goal: 5 (05/12/21 4888) ? ?Complications: No notable events documented. ?

## 2021-05-12 NOTE — Anesthesia Procedure Notes (Signed)
Procedure Name: Intubation ?Date/Time: 05/12/2021 9:41 AM ?Performed by: Georgeanne Nim, CRNA ?Pre-anesthesia Checklist: Patient identified, Emergency Drugs available, Suction available, Patient being monitored and Timeout performed ?Patient Re-evaluated:Patient Re-evaluated prior to induction ?Oxygen Delivery Method: Circle system utilized ?Preoxygenation: Pre-oxygenation with 100% oxygen ?Induction Type: IV induction ?Ventilation: Mask ventilation without difficulty ?Laryngoscope Size: Mac and 4 ?Grade View: Grade I ?Tube type: Oral ?Tube size: 7.0 mm ?Number of attempts: 1 ?Airway Equipment and Method: Stylet ?Placement Confirmation: ETT inserted through vocal cords under direct vision, positive ETCO2, CO2 detector and breath sounds checked- equal and bilateral ?Secured at: 20 cm ?Tube secured with: Tape ?Dental Injury: Teeth and Oropharynx as per pre-operative assessment  ? ? ? ? ?

## 2021-05-12 NOTE — Op Note (Signed)
05/12/2021 ? ?11:25 AM ? ?PATIENT:  Wendy Waller  56 y.o. female ? ?PRE-OPERATIVE DIAGNOSIS:  Postmenopausal Bleeding ?History of Tamoxifen Therapy ?Thickened Endometrium ? ?POST-OPERATIVE DIAGNOSIS:  Postmenopausal Bleeding ?History of Tamoxifen Therapy ?Thickened Endometrium ? ?PROCEDURE:  Procedure(s): ?XI ROBOTIC ASSISTED TOTAL HYSTERECTOMY WITH BILATERAL SALPINGO OOPHORECTOMY (Bilateral) ? ?SURGEON:  Surgeon(s) and Role: ?   Christophe Louis, MD - Primary ? ?PHYSICIAN ASSISTANT: None ? ?ASSISTANTS: Gaylord Shih RNFA assisted due to complexity of the anatomy   ? ?ANESTHESIA:   general ? ?EBL:  25 mL  ? ?BLOOD ADMINISTERED:none ? ?DRAINS: Urinary Catheter (Foley)  ? ?LOCAL MEDICATIONS USED:  OTHER ropivicaine ? ?SPECIMEN:  Source of Specimen:  uterus cervix and bilateral fallopian tubes and ovaries  ? ?DISPOSITION OF SPECIMEN:  PATHOLOGY ? ?COUNTS:  YES ? ?TOURNIQUET:  * No tourniquets in log * ? ?DICTATION: .Note written in EPIC ? ?PLAN OF CARE: Admit for overnight observation ? ?PATIENT DISPOSITION:  PACU - hemodynamically stable. ?  ?Delay start of Pharmacological VTE agent (>24hrs) due to surgical blood loss or risk of bleeding: not applicable ? ?Findings: normal appearing uterus. Fallopian tubes with appearance of previous tubal ligation. Simple appearing cyst on both ovaries.  ? ?Procedure: The patient was taken to the operating room where she was placed under general anesthesia.Time out was performed. Marland Kitchen She was placed in dorsal lithotomy position and prepped and draped in the usual sterile fashion. A weighted speculum was placed into the vagina. A Deaver was placed anteriorly for retraction. The anterior lip of the cervix was grasped with a single-tooth tenaculum. The vaginal mucosa was injected with 2.5 cc of ropivacaine at the 2/4/ 8 and 10 o'clock positions. The uterus was sounded to 8 cm. the cervix was dilated to 6 mm . 0 vicryl suture placed at the 12 and 6:00 positions Of the cervix to facilitate  placement of a Ru mi uterine manipulator. The manipulator was placed without difficulty. Weighted speculum and Deaver were removed .  ?Attention was turned to the patient's abdomen where a 8 mm trocar was placed  at the umbilicus. under direct visualization . The pneumoperitoneum was achieved with PCO2 gas. The laparoscope was removed. 60 cc of ropivacaine were injected into the abdominal cavity. The laparoscope was reinserted. An 8 mm trocar was placed in the right upper quadrant 16 centimeters from the umbilicus.later connected to robotic arm #3).  An 8 mm incision was made in the left upper quadrant 16 cm from the umbilicus and connected to robot arm #1. Marland Kitchen Attention was turned to the left upper quadrant where a 8 mm midclavicular assistant trocar was placed. ( All incision sites were injected with 10cc of ropivacaine prior to port placement. )  ?Once all ports had been placed under direct visualization.The laparoscope was removed and the [place] robotic system was thin right-sided docked. The robotic arms were connected to the corresponding trocars as listed above. The laparoscope was then reinserted. The long tip bipolar forceps were placed into port #1. A vessel sealer ( alternating with scissors) was placed in port #3. All instruments were directed into the pelvis under direct visualization.  ?Attention was turned to the surgeons console.. The left infundibulopelvic ligament was cauterized and transected with the vessel sealer The broad ligament was cauterized and transected with the vessel sealer .The round ligament was cauterized and transected with the vessel sealer  The anterior leaf of broad ligament was incised along the bladder reflection to the midline.  ?The right  Infundibulopelvic ligament was cauterized and transected with the vessel sealer. The right broad ligament was cauterized and transected with the vessel sealer. The right round ligament was cauterized and transected with the vessel sealer  The broad ligament was incised to the midline. The bladder was dissected off the lower uterine segments of the cervix via sharp and blunt dissection.  ? ?The uterine arteries were skeleton bilaterally. They were  cauterized and transected with the vessel sealer The KOH ring was identified. The anterior colpotomy was performed followed by the posterior colpotomy. Once the uterus,cervix and bilateral fallopian tubes were completely excised was removed through the vagina. ?The  bipolar forceps and scissors were removed and cobra forceps were placed in the port #1 and the mega needle driver was placed in to port #3.  ?The vaginal cuff was closed with running suture if 0 v-lock. The pelvis was irrigated. Marland KitchenMarland KitchenMarland KitchenExcellent hemostasis was noted. Arista was placed along the vaginal cuff.  All pelvic pedicles were examined and hemostasis was noted.  ?All instruments removed from the ports. All ports were removed under direct Visualization. The pneumoperitoneum was released. The skin incisions were closed with 4-0 Vicryl and then covered with Derma bond.  ? ?Attention was turned to the vagina. Pt was noted to have slight ooze from 1 cm posterior vaginal laceration which was controlled with silver nitrate. The foley catheter was removed.  ? ?  ?Sponge lap and needle counts weIre correct x. The patient was awakened from anesthesia and taken to the recovery room in stable condition.   ? ?

## 2021-05-12 NOTE — H&P (Signed)
Date of Initial H&P: 05/11/2021 ? ?History reviewed, patient examined, no change in status, stable for surgery.  ?

## 2021-05-15 ENCOUNTER — Encounter (HOSPITAL_BASED_OUTPATIENT_CLINIC_OR_DEPARTMENT_OTHER): Payer: Self-pay | Admitting: Obstetrics and Gynecology

## 2021-05-15 LAB — SURGICAL PATHOLOGY

## 2021-05-20 NOTE — Discharge Summary (Signed)
Physician Discharge Summary  ?Patient ID: ?Wendy Waller ?MRN: [id number] ?DOB/AGE: 1965-11-10 56 y.o. ? ?Admit date: 05/12/2021 ?Discharge date: 05/12/2021 ? ?Admission Diagnoses: postmenopausal bleeding/ thickened endometrium/ tamoxifen therapy / history of endometrial ablation  ? ?Discharge Diagnoses:  ?Principal Problem: ?  Postmenopausal bleeding ?Active Problems: ?  S/P hysterectomy with oophorectomy ? ? ?Discharged Condition: stable ? ?Hospital Course: pt was admitted for observation after undergoing XI ROBOTIC ASSISTED TOTAL HYSTERECTOMY WITH BILATERAL SALPINGO OOPHORECTOMY (Bilateral) she did well postoperatively with return of bowel and bladder function. She is discharge on pod #0 in stable condition . With pain well controlled  ? ?Consults: None ? ?Significant Diagnostic Studies: labs: None ? ?Treatments: surgery: XI ROBOTIC ASSISTED TOTAL HYSTERECTOMY WITH BILATERAL SALPINGO OOPHORECTOMY (Bilateral) ? ?Discharge Exam: ?Blood pressure 110/86, pulse 76, temperature 98.7 ?F (37.1 ?C), resp. rate 15, height 5' (1.524 m), weight 53.8 kg, last menstrual period 01/02/2019, SpO2 99 %. ?General appearance: alert, cooperative, and no distress ?Resp: no respiratory distress  ?GI: soft appropriately tender nondistended  ?Extremities: extremities normal, atraumatic, no cyanosis or edema ?Incision/Wound: ? ?Disposition: Discharge disposition: 01-Home or Self Care ? ? ? ? ? ? ?Discharge Instructions   ? ? Call MD for:  persistant nausea and vomiting   Complete by: As directed ?  ? Call MD for:  redness, tenderness, or signs of infection (pain, swelling, redness, odor or green/yellow discharge around incision site)   Complete by: As directed ?  ? Call MD for:  severe uncontrolled pain   Complete by: As directed ?  ? Call MD for:  temperature >100.4   Complete by: As directed ?  ? Diet - low sodium heart healthy   Complete by: As directed ?  ? Driving Restrictions   Complete by: As directed ?  ? Avoid driving for 1 week or  while taking narcotics for pain  ? Increase activity slowly   Complete by: As directed ?  ? Lifting restrictions   Complete by: As directed ?  ? Avoid lifting over 10 lbs  ? May shower / Bathe   Complete by: As directed ?  ? May walk up steps   Complete by: As directed ?  ? No wound care   Complete by: As directed ?  ? Sexual Activity Restrictions   Complete by: As directed ?  ? Avoid sexual activity until approved by Dr Landry Mellow  ? ?  ? ?Allergies as of 05/12/2021   ? ?   Reactions  ? Shellfish Allergy Nausea And Vomiting  ? Sever N/V with Oyster, clam, muscle's,  ?Ok with crab , shrimp, and lobster  ? Sulfa Antibiotics Itching, Rash  ? And heart rate increases  ? ?  ? ?  ?Medication List  ?  ? ?STOP taking these medications   ? ?diclofenac 75 MG EC tablet ?Commonly known as: VOLTAREN ?  ? ?  ? ?TAKE these medications   ? ?acetaminophen 500 MG tablet ?Commonly known as: TYLENOL ?Take 2 tablets (1,000 mg total) by mouth every 8 (eight) hours as needed for moderate pain or mild pain. ?  ?colestipol 1 g tablet ?Commonly known as: COLESTID ?Take 2 g by mouth 2 (two) times daily as needed. As needed for IBS. ?  ?diclofenac Sodium 1 % Gel ?Commonly known as: VOLTAREN ?Apply topically 4 (four) times daily as needed. ?  ?dicyclomine 20 MG tablet ?Commonly known as: BENTYL ?Take 20 mg by mouth as needed. ?  ?diphenhydrAMINE 25 MG tablet ?Commonly known as:  BENADRYL ?Take 25 mg by mouth at bedtime as needed. ?  ?fluticasone 50 MCG/ACT nasal spray ?Commonly known as: FLONASE ?Place into both nostrils daily as needed for allergies or rhinitis. ?  ?Humira 40 MG/0.4ML Pskt ?Generic drug: Adalimumab ?Inject into the skin every 14 (fourteen) days. Pt is stopping for > 14 days before  05/12/21 surgery per pt. ?  ?hydroxychloroquine 200 MG tablet ?Commonly known as: PLAQUENIL ?Take 1 tablet (200 mg total) by mouth 2 (two) times daily. ?  ?ibuprofen 600 MG tablet ?Commonly known as: ADVIL ?Take 1 tablet (600 mg total) by mouth every 6  (six) hours as needed. ?  ?lisinopril 10 MG tablet ?Commonly known as: ZESTRIL ?Take 10 mg by mouth at bedtime. ?  ?loratadine 10 MG tablet ?Commonly known as: CLARITIN ?Take 10 mg by mouth daily as needed. ?  ?oxyCODONE 5 MG immediate release tablet ?Commonly known as: Oxy IR/ROXICODONE ?Take 1-2 tablets (5-10 mg total) by mouth every 4 (four) hours as needed for moderate pain. ?  ?tamoxifen 10 MG tablet ?Commonly known as: NOLVADEX ?Take 1 tablet (10 mg total) by mouth 2 (two) times daily. ?  ? ?  ? ? Follow-up Information   ? ? Christophe Louis, MD. Go in 2 week(s).   ?Specialty: Obstetrics and Gynecology ?Contact information: ?[address] ?[address] ?[place] [address] ?[phone] ? ? ?  ?  ? ?  ?  ? ?  ? ? ?Signed: ?Christophe Louis ?05/20/2021, 1:25 PM ? ? ?

## 2021-06-14 ENCOUNTER — Ambulatory Visit
Admission: RE | Admit: 2021-06-14 | Discharge: 2021-06-14 | Disposition: A | Discharge status: Home / Self Care | Payer: BC Managed Care – PPO | Source: Ambulatory Visit | Attending: Hematology and Oncology | Admitting: Hematology and Oncology

## 2021-06-14 DIAGNOSIS — Z803 Family history of malignant neoplasm of breast: Secondary | ICD-10-CM | POA: Diagnosis not present

## 2021-06-14 DIAGNOSIS — N6092 Unspecified benign mammary dysplasia of left breast: Secondary | ICD-10-CM

## 2021-06-14 MED ORDER — GADOBUTROL 1 MMOL/ML IV SOLN
5.0000 mL | Freq: Once | INTRAVENOUS | Status: AC | PRN
  Administered 2021-06-14: 5 mL via INTRAVENOUS

## 2021-08-07 DIAGNOSIS — R768 Other specified abnormal immunological findings in serum: Secondary | ICD-10-CM | POA: Diagnosis not present

## 2021-08-07 DIAGNOSIS — M1991 Primary osteoarthritis, unspecified site: Secondary | ICD-10-CM | POA: Diagnosis not present

## 2021-08-07 DIAGNOSIS — Z79899 Other long term (current) drug therapy: Secondary | ICD-10-CM | POA: Diagnosis not present

## 2021-08-07 DIAGNOSIS — M0609 Rheumatoid arthritis without rheumatoid factor, multiple sites: Secondary | ICD-10-CM | POA: Diagnosis not present

## 2021-08-08 DIAGNOSIS — L570 Actinic keratosis: Secondary | ICD-10-CM | POA: Diagnosis not present

## 2021-08-08 DIAGNOSIS — D2271 Melanocytic nevi of right lower limb, including hip: Secondary | ICD-10-CM | POA: Diagnosis not present

## 2021-08-10 DIAGNOSIS — R059 Cough, unspecified: Secondary | ICD-10-CM | POA: Diagnosis not present

## 2021-08-10 DIAGNOSIS — I10 Essential (primary) hypertension: Secondary | ICD-10-CM | POA: Diagnosis not present

## 2021-10-02 DIAGNOSIS — D2271 Melanocytic nevi of right lower limb, including hip: Secondary | ICD-10-CM | POA: Diagnosis not present

## 2021-10-02 DIAGNOSIS — L821 Other seborrheic keratosis: Secondary | ICD-10-CM | POA: Diagnosis not present

## 2021-10-02 DIAGNOSIS — L814 Other melanin hyperpigmentation: Secondary | ICD-10-CM | POA: Diagnosis not present

## 2021-10-02 DIAGNOSIS — D2262 Melanocytic nevi of left upper limb, including shoulder: Secondary | ICD-10-CM | POA: Diagnosis not present

## 2021-10-02 DIAGNOSIS — L570 Actinic keratosis: Secondary | ICD-10-CM | POA: Diagnosis not present

## 2021-10-26 ENCOUNTER — Other Ambulatory Visit: Payer: Self-pay | Admitting: Family Medicine

## 2021-10-26 DIAGNOSIS — Z1231 Encounter for screening mammogram for malignant neoplasm of breast: Secondary | ICD-10-CM

## 2021-11-27 ENCOUNTER — Ambulatory Visit
Admission: RE | Admit: 2021-11-27 | Discharge: 2021-11-27 | Disposition: A | Discharge status: Home / Self Care | Payer: BC Managed Care – PPO | Source: Ambulatory Visit | Attending: Family Medicine | Admitting: Family Medicine

## 2021-11-27 DIAGNOSIS — Z1231 Encounter for screening mammogram for malignant neoplasm of breast: Secondary | ICD-10-CM

## 2021-12-18 DIAGNOSIS — H903 Sensorineural hearing loss, bilateral: Secondary | ICD-10-CM | POA: Diagnosis not present

## 2022-01-10 ENCOUNTER — Other Ambulatory Visit: Payer: Self-pay | Admitting: *Deleted

## 2022-01-10 MED ORDER — TAMOXIFEN CITRATE 10 MG PO TABS: 10.0000 mg | ORAL_TABLET | Freq: Every day | ORAL | 0 refills | Status: DC

## 2022-02-06 DIAGNOSIS — M0609 Rheumatoid arthritis without rheumatoid factor, multiple sites: Secondary | ICD-10-CM | POA: Diagnosis not present

## 2022-02-06 DIAGNOSIS — R5383 Other fatigue: Secondary | ICD-10-CM | POA: Diagnosis not present

## 2022-02-06 DIAGNOSIS — M1991 Primary osteoarthritis, unspecified site: Secondary | ICD-10-CM | POA: Diagnosis not present

## 2022-02-06 DIAGNOSIS — Z79899 Other long term (current) drug therapy: Secondary | ICD-10-CM | POA: Diagnosis not present

## 2022-02-12 ENCOUNTER — Ambulatory Visit: Payer: BC Managed Care – PPO | Admitting: Hematology and Oncology

## 2022-02-13 DIAGNOSIS — K648 Other hemorrhoids: Secondary | ICD-10-CM | POA: Diagnosis not present

## 2022-02-13 DIAGNOSIS — Z86010 Personal history of colonic polyps: Secondary | ICD-10-CM | POA: Diagnosis not present

## 2022-02-14 NOTE — Progress Notes (Signed)
Patient Care Team: Gaynelle Arabian, MD as PCP - General (Family Medicine)  DIAGNOSIS: No diagnosis found.  SUMMARY OF ONCOLOGIC HISTORY: Oncology History   No history exists.    CHIEF COMPLIANT:   INTERVAL HISTORY: Wendy Waller is a   ALLERGIES:  is allergic to shellfish allergy and sulfa antibiotics.  MEDICATIONS:  Current Outpatient Medications  Medication Sig Dispense Refill   acetaminophen (TYLENOL) 500 MG tablet Take 2 tablets (1,000 mg total) by mouth every 8 (eight) hours as needed for moderate pain or mild pain. 30 tablet 0   Adalimumab (HUMIRA) 40 MG/0.4ML PSKT Inject into the skin every 14 (fourteen) days. Pt is stopping for > 14 days before  05/12/21 surgery per pt.     colestipol (COLESTID) 1 g tablet Take 2 g by mouth 2 (two) times daily as needed. As needed for IBS.     diclofenac Sodium (VOLTAREN) 1 % GEL Apply topically 4 (four) times daily as needed.     dicyclomine (BENTYL) 20 MG tablet Take 20 mg by mouth as needed.     diphenhydrAMINE (BENADRYL) 25 MG tablet Take 25 mg by mouth at bedtime as needed.     fluticasone (FLONASE) 50 MCG/ACT nasal spray Place into both nostrils daily as needed for allergies or rhinitis.     hydroxychloroquine (PLAQUENIL) 200 MG tablet Take 1 tablet (200 mg total) by mouth 2 (two) times daily. (Patient not taking: Reported on 05/05/2021)     ibuprofen (ADVIL) 600 MG tablet Take 1 tablet (600 mg total) by mouth every 6 (six) hours as needed. 30 tablet 0   lisinopril (ZESTRIL) 10 MG tablet Take 10 mg by mouth at bedtime.     loratadine (CLARITIN) 10 MG tablet Take 10 mg by mouth daily as needed.     oxyCODONE (OXY IR/ROXICODONE) 5 MG immediate release tablet Take 1-2 tablets (5-10 mg total) by mouth every 4 (four) hours as needed for moderate pain. 20 tablet 0   tamoxifen (NOLVADEX) 10 MG tablet Take 1 tablet (10 mg total) by mouth daily. 90 tablet 0   No current facility-administered medications for this visit.    PHYSICAL  EXAMINATION: ECOG PERFORMANCE STATUS: {CHL ONC ECOG [phone]}  There were no vitals filed for this visit. There were no vitals filed for this visit.  BREAST:*** No palpable masses or nodules in either right or left breasts. No palpable axillary supraclavicular or infraclavicular adenopathy no breast tenderness or nipple discharge. (exam performed in the presence of a chaperone)  LABORATORY DATA:  I have reviewed the data as listed    Latest Ref Rng & Units 05/09/2021    2:39 PM 01/10/2021    7:49 AM 06/01/2014    2:19 PM  CMP  Glucose 70 - 99 mg/dL 92  91  87   BUN 6 - 20 mg/dL 15  16  14.7   Creatinine 0.44 - 1.00 mg/dL 0.31  0.60  0.8   Sodium 135 - 145 mmol/L 134  143  139   Potassium 3.5 - 5.1 mmol/L 4.1  4.1  4.0   Chloride 98 - 111 mmol/L 102  102    CO2 22 - 32 mmol/L 26   26   Calcium 8.9 - 10.3 mg/dL 8.6   8.8   Total Protein 6.4 - 8.3 g/dL   7.4   Total Bilirubin 0.20 - 1.20 mg/dL   0.26   Alkaline Phos 40 - 150 U/L   55   AST 5 - 34  U/L   17   ALT 0 - 55 U/L   15     Lab Results  Component Value Date   WBC 5.3 05/09/2021   HGB 11.8 (L) 05/09/2021   HCT 34.9 (L) 05/09/2021   MCV 95.4 05/09/2021   PLT 195 05/09/2021   NEUTROABS 2.9 06/01/2014    ASSESSMENT & PLAN:  No problem-specific Assessment & Plan notes found for this encounter.    No orders of the defined types were placed in this encounter.  The patient has a good understanding of the overall plan. she agrees with it. she will call with any problems that may develop before the next visit here. Total time spent: 30 mins including face to face time and time spent for planning, charting and co-ordination of care   Suzzette Righter, [place] 02/14/22    I Gardiner Coins am acting as a Education administrator for [employer]  ***

## 2022-02-15 ENCOUNTER — Other Ambulatory Visit: Payer: Self-pay

## 2022-02-15 ENCOUNTER — Inpatient Hospital Stay: Payer: BC Managed Care – PPO | Attending: Hematology and Oncology | Admitting: Hematology and Oncology

## 2022-02-15 VITALS — BP 138/73 | HR 79 | Temp 97.4°F | Resp 18 | Ht 60.0 in | Wt 123.4 lb

## 2022-02-15 DIAGNOSIS — N951 Menopausal and female climacteric states: Secondary | ICD-10-CM | POA: Insufficient documentation

## 2022-02-15 DIAGNOSIS — N6092 Unspecified benign mammary dysplasia of left breast: Secondary | ICD-10-CM | POA: Insufficient documentation

## 2022-02-15 DIAGNOSIS — Z79810 Long term (current) use of selective estrogen receptor modulators (SERMs): Secondary | ICD-10-CM | POA: Diagnosis not present

## 2022-02-15 MED ORDER — VEOZAH 45 MG PO TABS: 1.0000 | ORAL_TABLET | Freq: Every day | ORAL | 11 refills | Status: AC

## 2022-02-15 NOTE — Assessment & Plan Note (Addendum)
Lobular carcinoma In situ /fibrocystic disease/atypical lobular hyperplasia/PASH of the left breast status post lumpectomy in May 2015. Status post biopsy of the right breast suspicious nodule and was negative for any malignancy or precancerous lesions; currently on observation   Breast cancer surveillance: 1. Breast exam 12/25/2018: No evidence of malignancy. 2. 3D mammogram 08/30/2018: Benign, breast density category D 3.  Breast MRI 01/20/2018: No evidence of malignancy, breast density category C With no plan to do MRIs of the breast every other year.   Based on [place] for risk of breast cancer, patient has a 20% risk of breast cancer in 10 years and is 68% risk of breast cancer by the age of 66.   Tamoxifen Toxicities: Taking 5 mg daily started 2019 Moderate to severe hot flashes: I instructed her to start taking the 5 mg dose 3 times a week starting 02/15/2021 Uterine hypertrophy: Patient has had multiple attempts to get an endometrial biopsy but it was not satisfactory because of prior history of ablation.  She might be undergoing hysterectomy and bilateral salpingo-oophorectomy February 2023.   Breast Cancer Surveillance:  1.  Mammogram 11/28/21: Benign breast density category C 2. MRI: 06/15/2021: Benign, density category C Next MRI of the breast to be done April 2025.   Severe hot flashes: I sent a prescription for Veozah.  If it was not approved then we will send a prescription for Effexor.   RTC in 1 year

## 2022-02-27 ENCOUNTER — Telehealth: Payer: Self-pay

## 2022-02-27 NOTE — Telephone Encounter (Signed)
Notified Patient of prior authorization approval for Veozah '45mg'$  Tablets. Medication is approved through 02/27/2023. No other needs or concerns voiced at this time.

## 2022-02-27 NOTE — Telephone Encounter (Signed)
Pt called to see if her VEOZAH had been approved. Advised pt I sent a message over to PA nurse to inquire about PA. If PA is unobtainable we will prescribe Effexor per MD. She is aware and knows we will be in contact with her regarding answer.

## 2022-04-02 DIAGNOSIS — R059 Cough, unspecified: Secondary | ICD-10-CM | POA: Diagnosis not present

## 2022-04-02 DIAGNOSIS — Z03818 Encounter for observation for suspected exposure to other biological agents ruled out: Secondary | ICD-10-CM | POA: Diagnosis not present

## 2022-04-02 DIAGNOSIS — B349 Viral infection, unspecified: Secondary | ICD-10-CM | POA: Diagnosis not present

## 2022-04-02 DIAGNOSIS — J9801 Acute bronchospasm: Secondary | ICD-10-CM | POA: Diagnosis not present

## 2022-07-11 DIAGNOSIS — N644 Mastodynia: Secondary | ICD-10-CM | POA: Diagnosis not present

## 2022-07-11 DIAGNOSIS — Z853 Personal history of malignant neoplasm of breast: Secondary | ICD-10-CM | POA: Diagnosis not present

## 2022-07-11 DIAGNOSIS — N951 Menopausal and female climacteric states: Secondary | ICD-10-CM | POA: Diagnosis not present

## 2022-07-12 ENCOUNTER — Other Ambulatory Visit: Payer: Self-pay | Admitting: Obstetrics and Gynecology

## 2022-07-12 DIAGNOSIS — N644 Mastodynia: Secondary | ICD-10-CM

## 2022-07-17 DIAGNOSIS — N951 Menopausal and female climacteric states: Secondary | ICD-10-CM | POA: Diagnosis not present

## 2022-07-19 DIAGNOSIS — M79672 Pain in left foot: Secondary | ICD-10-CM | POA: Diagnosis not present

## 2022-07-30 ENCOUNTER — Ambulatory Visit
Admission: RE | Admit: 2022-07-30 | Discharge: 2022-07-30 | Disposition: A | Discharge status: Home / Self Care | Payer: BC Managed Care – PPO | Source: Ambulatory Visit | Attending: Obstetrics and Gynecology | Admitting: Obstetrics and Gynecology

## 2022-07-30 DIAGNOSIS — N644 Mastodynia: Secondary | ICD-10-CM

## 2022-07-30 DIAGNOSIS — R92332 Mammographic heterogeneous density, left breast: Secondary | ICD-10-CM | POA: Diagnosis not present

## 2022-08-08 DIAGNOSIS — R768 Other specified abnormal immunological findings in serum: Secondary | ICD-10-CM | POA: Diagnosis not present

## 2022-08-08 DIAGNOSIS — M0609 Rheumatoid arthritis without rheumatoid factor, multiple sites: Secondary | ICD-10-CM | POA: Diagnosis not present

## 2022-08-08 DIAGNOSIS — Z79899 Other long term (current) drug therapy: Secondary | ICD-10-CM | POA: Diagnosis not present

## 2022-08-08 DIAGNOSIS — M1991 Primary osteoarthritis, unspecified site: Secondary | ICD-10-CM | POA: Diagnosis not present

## 2022-08-09 DIAGNOSIS — I10 Essential (primary) hypertension: Secondary | ICD-10-CM | POA: Diagnosis not present

## 2022-09-13 DIAGNOSIS — N951 Menopausal and female climacteric states: Secondary | ICD-10-CM | POA: Diagnosis not present

## 2022-11-05 ENCOUNTER — Other Ambulatory Visit: Payer: Self-pay | Admitting: Hematology and Oncology

## 2022-11-05 DIAGNOSIS — Z1231 Encounter for screening mammogram for malignant neoplasm of breast: Secondary | ICD-10-CM

## 2022-11-29 ENCOUNTER — Ambulatory Visit
Admission: RE | Admit: 2022-11-29 | Discharge: 2022-11-29 | Disposition: A | Discharge status: Home / Self Care | Payer: BC Managed Care – PPO | Source: Ambulatory Visit | Attending: Hematology and Oncology | Admitting: Hematology and Oncology

## 2022-11-29 DIAGNOSIS — Z1231 Encounter for screening mammogram for malignant neoplasm of breast: Secondary | ICD-10-CM

## 2022-12-06 DIAGNOSIS — L570 Actinic keratosis: Secondary | ICD-10-CM | POA: Diagnosis not present

## 2022-12-06 DIAGNOSIS — L820 Inflamed seborrheic keratosis: Secondary | ICD-10-CM | POA: Diagnosis not present

## 2022-12-25 DIAGNOSIS — D225 Melanocytic nevi of trunk: Secondary | ICD-10-CM | POA: Diagnosis not present

## 2022-12-25 DIAGNOSIS — D2271 Melanocytic nevi of right lower limb, including hip: Secondary | ICD-10-CM | POA: Diagnosis not present

## 2022-12-25 DIAGNOSIS — L814 Other melanin hyperpigmentation: Secondary | ICD-10-CM | POA: Diagnosis not present

## 2022-12-25 DIAGNOSIS — D2261 Melanocytic nevi of right upper limb, including shoulder: Secondary | ICD-10-CM | POA: Diagnosis not present

## 2022-12-28 DIAGNOSIS — J018 Other acute sinusitis: Secondary | ICD-10-CM | POA: Diagnosis not present

## 2023-01-14 DIAGNOSIS — N951 Menopausal and female climacteric states: Secondary | ICD-10-CM | POA: Diagnosis not present

## 2023-02-07 DIAGNOSIS — R768 Other specified abnormal immunological findings in serum: Secondary | ICD-10-CM | POA: Diagnosis not present

## 2023-02-07 DIAGNOSIS — M1991 Primary osteoarthritis, unspecified site: Secondary | ICD-10-CM | POA: Diagnosis not present

## 2023-02-07 DIAGNOSIS — R5383 Other fatigue: Secondary | ICD-10-CM | POA: Diagnosis not present

## 2023-02-07 DIAGNOSIS — M0609 Rheumatoid arthritis without rheumatoid factor, multiple sites: Secondary | ICD-10-CM | POA: Diagnosis not present

## 2023-02-07 DIAGNOSIS — Z79899 Other long term (current) drug therapy: Secondary | ICD-10-CM | POA: Diagnosis not present

## 2023-02-18 ENCOUNTER — Inpatient Hospital Stay: Payer: BC Managed Care – PPO | Attending: Hematology and Oncology | Admitting: Hematology and Oncology

## 2023-02-18 VITALS — BP 122/69 | HR 92 | Temp 97.5°F | Resp 18 | Ht 60.0 in | Wt 124.9 lb

## 2023-02-18 DIAGNOSIS — N951 Menopausal and female climacteric states: Secondary | ICD-10-CM | POA: Diagnosis not present

## 2023-02-18 DIAGNOSIS — N6092 Unspecified benign mammary dysplasia of left breast: Secondary | ICD-10-CM | POA: Insufficient documentation

## 2023-02-18 DIAGNOSIS — Z79899 Other long term (current) drug therapy: Secondary | ICD-10-CM | POA: Insufficient documentation

## 2023-02-18 NOTE — Assessment & Plan Note (Signed)
Lobular carcinoma In situ /fibrocystic disease/atypical lobular hyperplasia/PASH of the left breast status post lumpectomy in May 2015. Status post biopsy of the right breast suspicious nodule and was negative for any malignancy or precancerous lesions; currently on observation   Breast cancer surveillance: 1. Breast exam 12/25/2018: No evidence of malignancy. 2. 3D mammogram 08/30/2018: Benign, breast density category D 3.  Breast MRI 01/20/2018: No evidence of malignancy, breast density category C With no plan to do MRIs of the breast every other year.   Based on [employer] model for risk of breast cancer, patient has a 20% risk of breast cancer in 10 years and is 68% risk of breast cancer by the age of 48.   Tamoxifen Toxicities: Taking 5 mg daily started 2019 Moderate to severe hot flashes: I instructed her to start taking the 5 mg dose 3 times a week starting 02/15/2021 Uterine hypertrophy: Patient has had multiple attempts to get an endometrial biopsy but it was not satisfactory because of prior history of ablation.  She might be undergoing hysterectomy and bilateral salpingo-oophorectomy February 2023.   Hysterectomy March 2023: Hot flashes have gotten worse subsequently.   Breast Cancer Surveillance:  1.  Mammogram 11/28/21: Benign breast density category C 2. MRI: 06/15/2021: Benign, density category C Next MRI of the breast to be done April 2025.   Severe hot flashes: I sent a prescription for Veozah.  If it was not approved then we will send a prescription for Effexor.   RTC in 1 year

## 2023-02-18 NOTE — Progress Notes (Signed)
Patient Care Team: Blair Heys, MD (Inactive) as PCP - General (Family Medicine)  DIAGNOSIS:  Encounter Diagnosis  Name Primary?   Atypical lobular hyperplasia of left breast Yes   CHIEF COMPLIANT: Follow-up of history of ALH left breast  HISTORY OF PRESENT ILLNESS:  History of Present Illness   The patient, with a history of lobular hyperplasia and menopausal symptoms, reports an improvement in hot flashes since starting Veozah. The frequency of hot flashes has decreased from 10-15 episodes in a half-day to a couple per day. The patient has been on tamoxifen for almost six years and has been managing pain with ibuprofen. The patient also mentions a change in insurance coverage, which may affect her access to Humira.       ALLERGIES:  is allergic to methotrexate, shellfish allergy, tadalafil, and sulfa antibiotics.  MEDICATIONS:  Current Outpatient Medications  Medication Sig Dispense Refill   acetaminophen (TYLENOL) 500 MG tablet Take 2 tablets (1,000 mg total) by mouth every 8 (eight) hours as needed for moderate pain or mild pain. 30 tablet 0   Adalimumab (HUMIRA) 40 MG/0.4ML PSKT Inject into the skin every 14 (fourteen) days. Pt is stopping for > 14 days before  05/12/21 surgery per pt.     dicyclomine (BENTYL) 20 MG tablet Take 20 mg by mouth as needed.     diphenhydrAMINE (BENADRYL) 25 MG tablet Take 25 mg by mouth at bedtime as needed.     Fezolinetant (VEOZAH) 45 MG TABS Take 1 tablet by mouth daily. 30 tablet 11   fluticasone (FLONASE) 50 MCG/ACT nasal spray Place into both nostrils daily as needed for allergies or rhinitis.     ibuprofen (ADVIL) 600 MG tablet Take 1 tablet (600 mg total) by mouth every 6 (six) hours as needed. 30 tablet 0   lisinopril (ZESTRIL) 10 MG tablet Take 10 mg by mouth at bedtime.     loratadine (CLARITIN) 10 MG tablet Take 10 mg by mouth daily as needed.     No current facility-administered medications for this visit.    PHYSICAL  EXAMINATION: ECOG PERFORMANCE STATUS: 1 - Symptomatic but completely ambulatory  Vitals:   02/18/23 1449  BP: 122/69  Pulse: 92  Resp: 18  Temp: (!) 97.5 F (36.4 C)  SpO2: 100%   Filed Weights   02/18/23 1449  Weight: 124 lb 14.4 oz (56.7 kg)      LABORATORY DATA:  I have reviewed the data as listed    Latest Ref Rng & Units 05/09/2021    2:39 PM 01/10/2021    7:49 AM 06/01/2014    2:19 PM  CMP  Glucose 70 - 99 mg/dL 92  91  87   BUN 6 - 20 mg/dL 15  16  16.1   Creatinine 0.44 - 1.00 mg/dL 0.96  0.45  0.8   Sodium 135 - 145 mmol/L 134  143  139   Potassium 3.5 - 5.1 mmol/L 4.1  4.1  4.0   Chloride 98 - 111 mmol/L 102  102    CO2 22 - 32 mmol/L 26   26   Calcium 8.9 - 10.3 mg/dL 8.6   8.8   Total Protein 6.4 - 8.3 g/dL   7.4   Total Bilirubin 0.20 - 1.20 mg/dL   4.09   Alkaline Phos 40 - 150 U/L   55   AST 5 - 34 U/L   17   ALT 0 - 55 U/L   15     Lab  Results  Component Value Date   WBC 5.3 05/09/2021   HGB 11.8 (L) 05/09/2021   HCT 34.9 (L) 05/09/2021   MCV 95.4 05/09/2021   PLT 195 05/09/2021   NEUTROABS 2.9 06/01/2014    ASSESSMENT & PLAN:  History of ALH: Recommended discontinuation of tamoxifen therapy having completed 5 years.  She is relieved to hear that.    Menopausal Symptoms Improved hot flashes on Veozah. Discussed recent warning about liver enzymes and confirmed recent liver function tests were normal. -Continue Veozah and monitor liver function tests semi-annually.  Breast Cancer History Completed 5 years of Tamoxifen therapy. No current evidence of disease recurrence. -Discontinue Tamoxifen.  Breast Cancer Surveillance Last mammogram on December 03, 2022 was normal. Discussed transition from regular mammograms to contrast enhanced mammograms (CEM) for improved imaging. -Order CEM for October 2025.  Follow-up in 1 year or sooner if any new concerns arise.          Orders Placed This Encounter  Procedures   MM 2D DIAG BILAT WITH  CONTRAST BCG ONLY    Standing Status:   Future    Expected Date:   12/04/2023    Expiration Date:   02/18/2024    Reason for Exam (SYMPTOM  OR DIAGNOSIS REQUIRED):   high risk breast cancr    If indicated for the ordered procedure, I authorize the administration of contrast media per Radiology protocol:   Yes    Does the patient have a contrast media/X-ray dye allergy?:   No    Is the patient pregnant?:   No    Preferred [facility]?:   [facility]    Release to patient:   Immediate   The patient has a good understanding of the overall plan. she agrees with it. she will call with any problems that may develop before the next visit here. Total time spent: 30 mins including face to face time and time spent for planning, charting and co-ordination of care   Tamsen Meek, MD 02/18/23

## 2023-03-22 IMAGING — MG MM DIGITAL SCREENING BILAT W/ TOMO AND CAD
8 series · 9 of 24 positions shown · non-contrast
Comparison: Previous exam(s).

CLINICAL DATA: Screening.

EXAM:
DIGITAL SCREENING BILATERAL MAMMOGRAM WITH TOMOSYNTHESIS AND CAD
TECHNIQUE: Bilateral screening digital craniocaudal and mediolateral oblique
mammograms were obtained. Bilateral screening digital breast
tomosynthesis was performed. The images were evaluated with
computer-aided detection.

[R CC synth-2D]
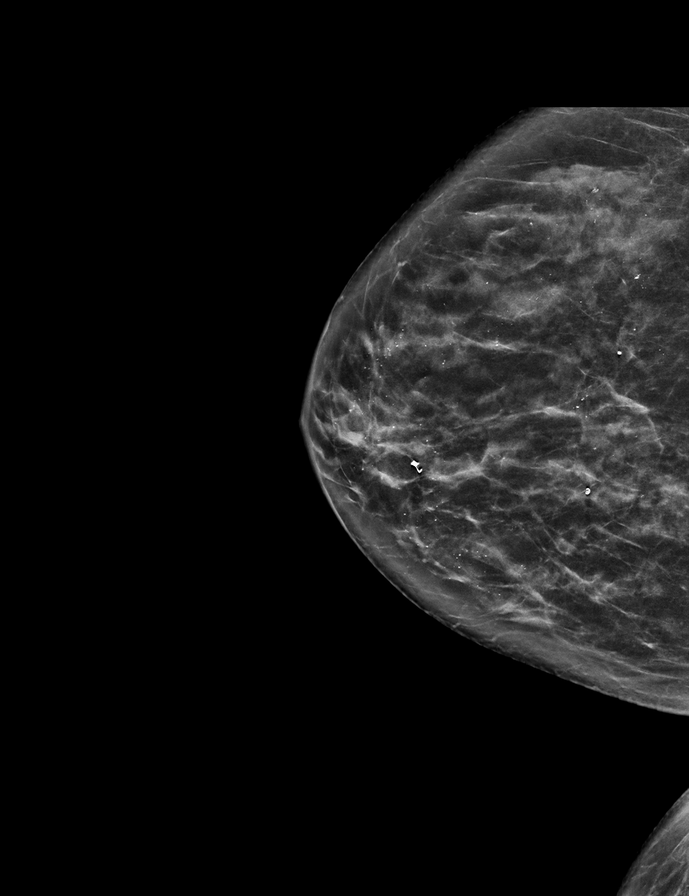

[L CC synth-2D]
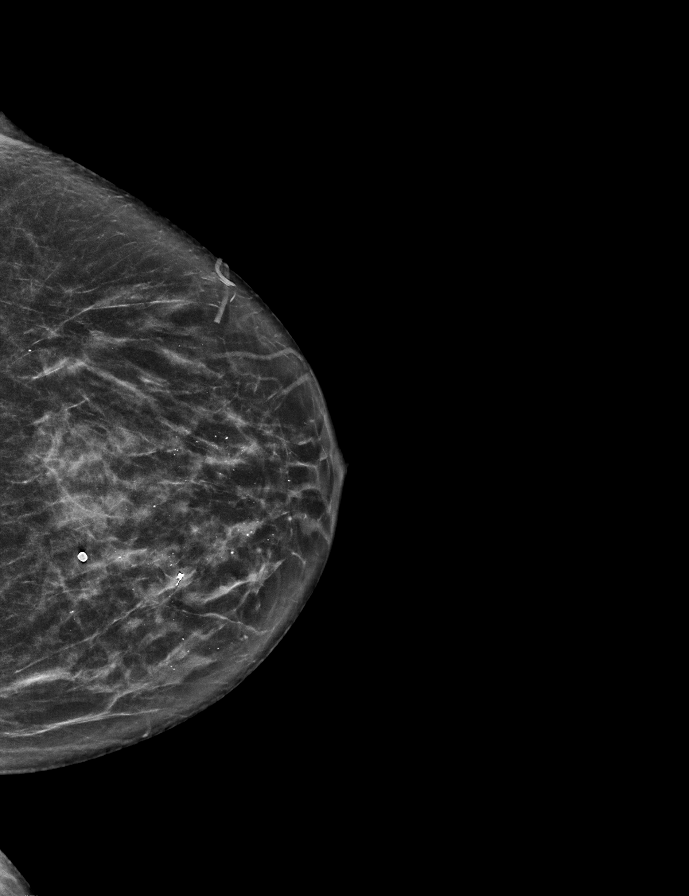

[R MLO synth-2D]
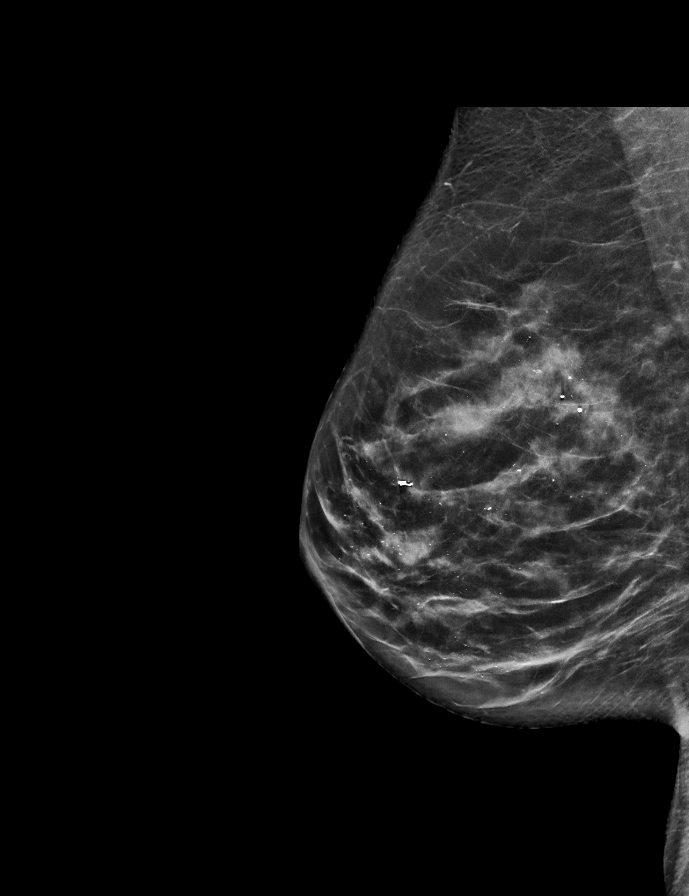

[L MLO synth-2D]
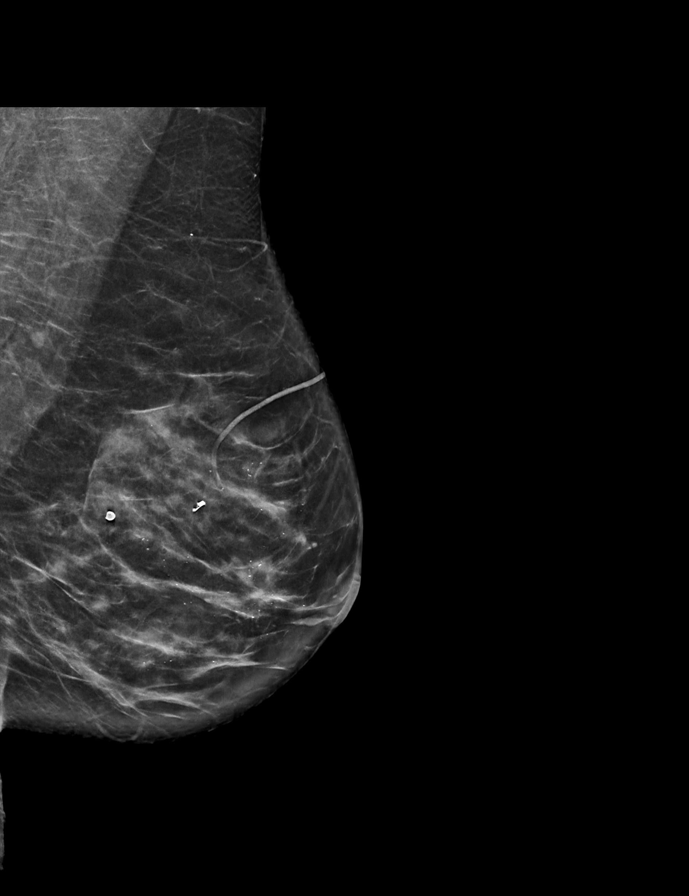

[L MLO tomo · 2 of 68 frames shown]
[frame 22/68]
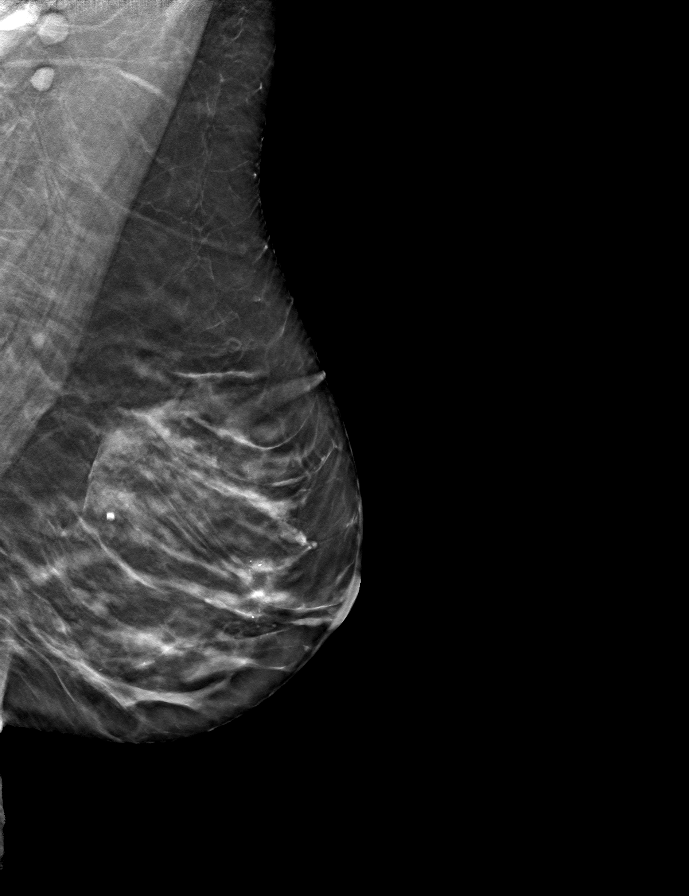
[frame 35/68]
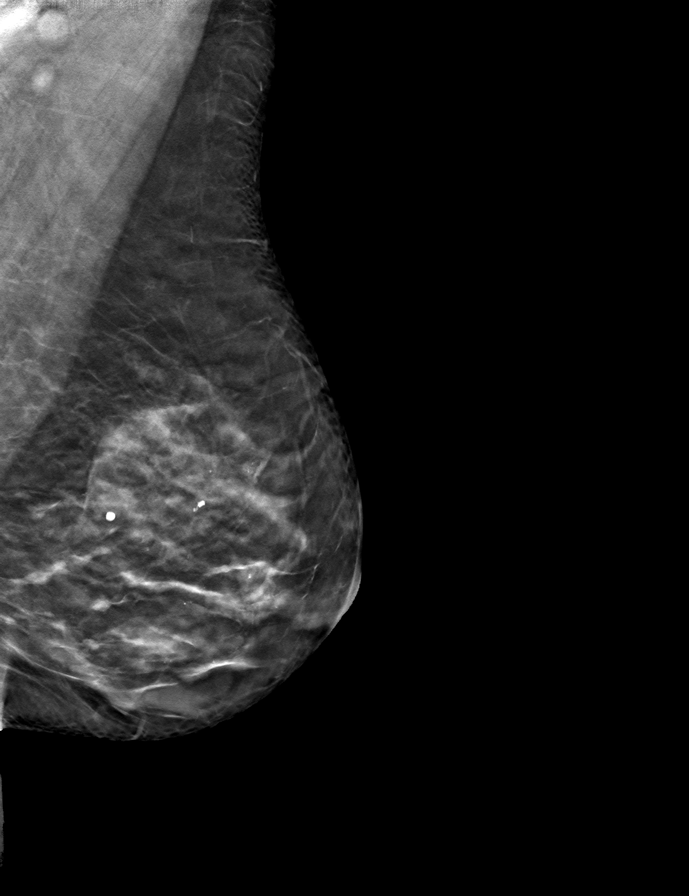

[R CC tomo · tomo slice 31/61.0]
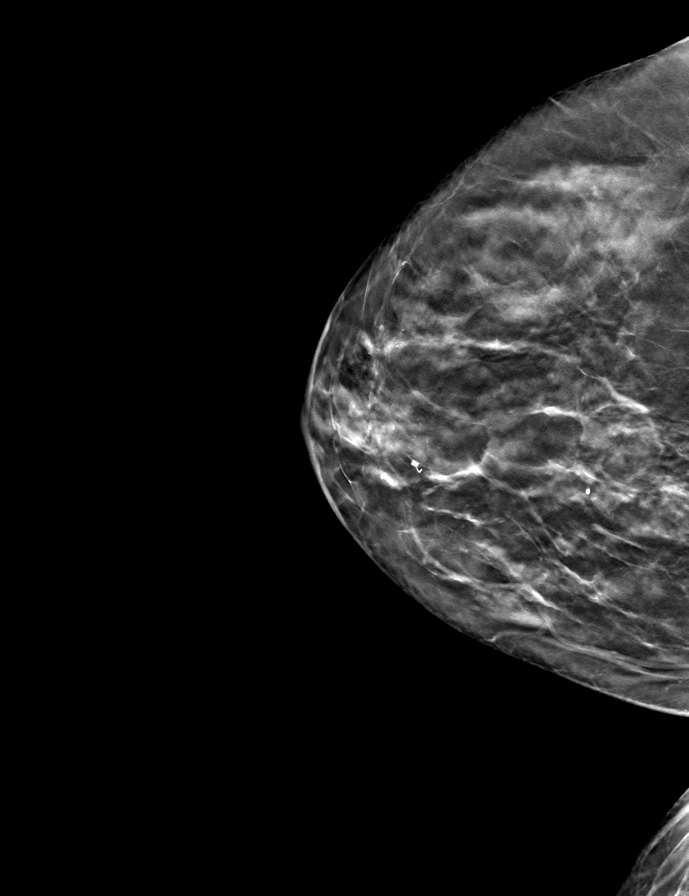

[R MLO tomo · tomo slice 33/66.0]
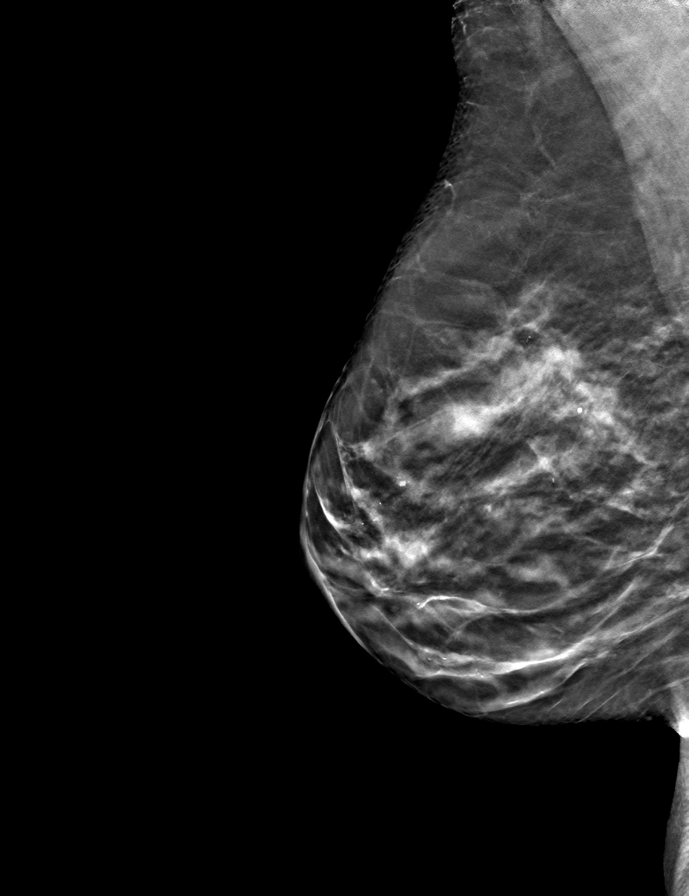

[L CC tomo · tomo slice 29/57.0]
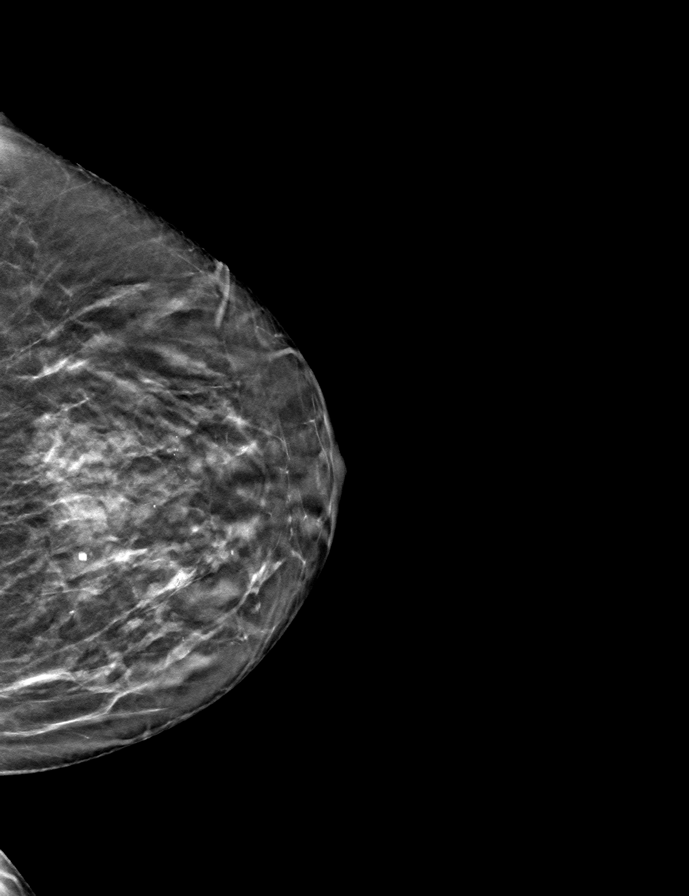

[9 of 24 positions shown; findings below may reference images not displayed]

ACR Breast Density Category c: The breast tissue is heterogeneously
dense, which may obscure small masses.
FINDINGS: There are no findings suspicious for malignancy.
IMPRESSION: No mammographic evidence of malignancy. A result letter of this
screening mammogram will be mailed directly to the patient.

RECOMMENDATION:
Screening mammogram in one year. (Code:[id number])

BI-RADS CATEGORY  1: Negative.

## 2023-04-26 DIAGNOSIS — R6882 Decreased libido: Secondary | ICD-10-CM | POA: Diagnosis not present

## 2023-04-26 DIAGNOSIS — Z853 Personal history of malignant neoplasm of breast: Secondary | ICD-10-CM | POA: Diagnosis not present

## 2023-04-26 DIAGNOSIS — N951 Menopausal and female climacteric states: Secondary | ICD-10-CM | POA: Diagnosis not present

## 2023-05-16 DIAGNOSIS — M0609 Rheumatoid arthritis without rheumatoid factor, multiple sites: Secondary | ICD-10-CM | POA: Diagnosis not present

## 2023-05-16 DIAGNOSIS — M1991 Primary osteoarthritis, unspecified site: Secondary | ICD-10-CM | POA: Diagnosis not present

## 2023-05-16 DIAGNOSIS — M542 Cervicalgia: Secondary | ICD-10-CM | POA: Diagnosis not present

## 2023-05-16 DIAGNOSIS — R768 Other specified abnormal immunological findings in serum: Secondary | ICD-10-CM | POA: Diagnosis not present

## 2023-05-16 DIAGNOSIS — Z79899 Other long term (current) drug therapy: Secondary | ICD-10-CM | POA: Diagnosis not present

## 2023-06-21 DIAGNOSIS — Z853 Personal history of malignant neoplasm of breast: Secondary | ICD-10-CM | POA: Diagnosis not present

## 2023-06-21 DIAGNOSIS — N951 Menopausal and female climacteric states: Secondary | ICD-10-CM | POA: Diagnosis not present

## 2023-06-21 DIAGNOSIS — Z01419 Encounter for gynecological examination (general) (routine) without abnormal findings: Secondary | ICD-10-CM | POA: Diagnosis not present

## 2023-07-19 DIAGNOSIS — L570 Actinic keratosis: Secondary | ICD-10-CM | POA: Diagnosis not present

## 2023-07-19 DIAGNOSIS — L578 Other skin changes due to chronic exposure to nonionizing radiation: Secondary | ICD-10-CM | POA: Diagnosis not present

## 2023-08-08 DIAGNOSIS — Z136 Encounter for screening for cardiovascular disorders: Secondary | ICD-10-CM | POA: Diagnosis not present

## 2023-08-08 DIAGNOSIS — Z0000 Encounter for general adult medical examination without abnormal findings: Secondary | ICD-10-CM | POA: Diagnosis not present

## 2023-08-08 DIAGNOSIS — K900 Celiac disease: Secondary | ICD-10-CM | POA: Diagnosis not present

## 2023-08-08 DIAGNOSIS — M351 Other overlap syndromes: Secondary | ICD-10-CM | POA: Diagnosis not present

## 2023-08-08 DIAGNOSIS — I10 Essential (primary) hypertension: Secondary | ICD-10-CM | POA: Diagnosis not present

## 2023-08-08 DIAGNOSIS — N951 Menopausal and female climacteric states: Secondary | ICD-10-CM | POA: Diagnosis not present

## 2023-09-18 DIAGNOSIS — R197 Diarrhea, unspecified: Secondary | ICD-10-CM | POA: Diagnosis not present

## 2023-09-18 DIAGNOSIS — K900 Celiac disease: Secondary | ICD-10-CM | POA: Diagnosis not present

## 2023-09-18 DIAGNOSIS — Z860100 Personal history of colon polyps, unspecified: Secondary | ICD-10-CM | POA: Diagnosis not present

## 2023-09-18 DIAGNOSIS — R109 Unspecified abdominal pain: Secondary | ICD-10-CM | POA: Diagnosis not present

## 2023-09-19 DIAGNOSIS — R197 Diarrhea, unspecified: Secondary | ICD-10-CM | POA: Diagnosis not present

## 2023-11-11 DIAGNOSIS — K638219 Small intestinal bacterial overgrowth, unspecified: Secondary | ICD-10-CM | POA: Diagnosis not present

## 2023-11-14 DIAGNOSIS — M1991 Primary osteoarthritis, unspecified site: Secondary | ICD-10-CM | POA: Diagnosis not present

## 2023-11-14 DIAGNOSIS — Z79899 Other long term (current) drug therapy: Secondary | ICD-10-CM | POA: Diagnosis not present

## 2023-11-14 DIAGNOSIS — R109 Unspecified abdominal pain: Secondary | ICD-10-CM | POA: Diagnosis not present

## 2023-11-14 DIAGNOSIS — M0609 Rheumatoid arthritis without rheumatoid factor, multiple sites: Secondary | ICD-10-CM | POA: Diagnosis not present

## 2023-11-15 DIAGNOSIS — R1084 Generalized abdominal pain: Secondary | ICD-10-CM | POA: Diagnosis not present

## 2023-11-15 DIAGNOSIS — R197 Diarrhea, unspecified: Secondary | ICD-10-CM | POA: Diagnosis not present

## 2023-11-15 DIAGNOSIS — R140 Abdominal distension (gaseous): Secondary | ICD-10-CM | POA: Diagnosis not present

## 2023-12-11 ENCOUNTER — Encounter

## 2023-12-18 DIAGNOSIS — R197 Diarrhea, unspecified: Secondary | ICD-10-CM | POA: Diagnosis not present

## 2023-12-18 DIAGNOSIS — K6389 Other specified diseases of intestine: Secondary | ICD-10-CM | POA: Diagnosis not present

## 2023-12-18 DIAGNOSIS — K648 Other hemorrhoids: Secondary | ICD-10-CM | POA: Diagnosis not present

## 2023-12-20 ENCOUNTER — Ambulatory Visit
Admission: RE | Admit: 2023-12-20 | Discharge: 2023-12-20 | Disposition: A | Discharge status: Home / Self Care | Source: Ambulatory Visit | Attending: Hematology and Oncology | Admitting: Hematology and Oncology

## 2023-12-20 ENCOUNTER — Other Ambulatory Visit: Payer: Self-pay | Admitting: Hematology and Oncology

## 2023-12-20 DIAGNOSIS — N6092 Unspecified benign mammary dysplasia of left breast: Secondary | ICD-10-CM

## 2023-12-20 DIAGNOSIS — R928 Other abnormal and inconclusive findings on diagnostic imaging of breast: Secondary | ICD-10-CM | POA: Diagnosis not present

## 2023-12-20 DIAGNOSIS — N6320 Unspecified lump in the left breast, unspecified quadrant: Secondary | ICD-10-CM

## 2023-12-20 MED ORDER — IOPAMIDOL (ISOVUE-370) INJECTION 76%
100.0000 mL | Freq: Once | INTRAVENOUS | Status: AC | PRN
Start: 2023-12-20 — End: 2023-12-20
  Administered 2023-12-20: 100 mL via INTRAVENOUS

## 2023-12-25 ENCOUNTER — Ambulatory Visit
Admission: RE | Admit: 2023-12-25 | Discharge: 2023-12-25 | Disposition: A | Source: Ambulatory Visit | Attending: Hematology and Oncology

## 2023-12-25 ENCOUNTER — Ambulatory Visit
Admission: RE | Admit: 2023-12-25 | Discharge: 2023-12-25 | Disposition: A | Discharge status: Home / Self Care | Source: Ambulatory Visit | Attending: Hematology and Oncology | Admitting: Hematology and Oncology

## 2023-12-25 DIAGNOSIS — N6320 Unspecified lump in the left breast, unspecified quadrant: Secondary | ICD-10-CM

## 2023-12-25 DIAGNOSIS — N6002 Solitary cyst of left breast: Secondary | ICD-10-CM | POA: Diagnosis not present

## 2023-12-25 DIAGNOSIS — N6489 Other specified disorders of breast: Secondary | ICD-10-CM

## 2023-12-25 DIAGNOSIS — N6092 Unspecified benign mammary dysplasia of left breast: Secondary | ICD-10-CM

## 2023-12-25 DIAGNOSIS — N6322 Unspecified lump in the left breast, upper inner quadrant: Secondary | ICD-10-CM | POA: Diagnosis not present

## 2023-12-25 HISTORY — PX: BREAST BIOPSY: SHX20

## 2023-12-26 LAB — SURGICAL PATHOLOGY

## 2024-01-06 DIAGNOSIS — L570 Actinic keratosis: Secondary | ICD-10-CM | POA: Diagnosis not present

## 2024-01-06 DIAGNOSIS — L814 Other melanin hyperpigmentation: Secondary | ICD-10-CM | POA: Diagnosis not present

## 2024-01-27 ENCOUNTER — Inpatient Hospital Stay: Attending: Hematology and Oncology | Admitting: Hematology and Oncology

## 2024-01-27 DIAGNOSIS — N951 Menopausal and female climacteric states: Secondary | ICD-10-CM | POA: Insufficient documentation

## 2024-01-27 DIAGNOSIS — Z79899 Other long term (current) drug therapy: Secondary | ICD-10-CM | POA: Insufficient documentation

## 2024-01-27 DIAGNOSIS — N898 Other specified noninflammatory disorders of vagina: Secondary | ICD-10-CM | POA: Insufficient documentation

## 2024-01-27 DIAGNOSIS — N6092 Unspecified benign mammary dysplasia of left breast: Secondary | ICD-10-CM | POA: Diagnosis not present

## 2024-01-27 MED ORDER — ESTRADIOL 0.01 % VA CREA: 1.0000 | TOPICAL_CREAM | Freq: Every day | VAGINAL | 3 refills | Status: AC

## 2024-01-27 NOTE — Assessment & Plan Note (Signed)
 History of ALH: Recommended discontinuation of tamoxifen  therapy having completed 5 years.  She is relieved to hear that.    Menopausal Symptoms Improved hot flashes on Veozah . Discussed recent warning about liver enzymes and confirmed recent liver function tests were normal. -Continue Veozah  and monitor liver function tests semi-annually.   Breast Cancer Surveillance: Breast exam 01/27/2024: Benign contrast enhanced mammograms (CEM) f 01/20/2024: Indeterminate focus of enhancement upper central left breast 0.6 cm biopsy: PASH -Order CEM annually   Follow-up in 1 year or sooner if any new concerns arise.

## 2024-01-27 NOTE — Progress Notes (Signed)
 Patient Care Team: Wendy Charleston, MD (Inactive) as PCP - General (Family Medicine)  DIAGNOSIS:  Encounter Diagnosis  Name Primary?   Atypical lobular hyperplasia of left breast Yes    CHIEF COMPLIANT: Follow-up to discuss results of recently performed biopsy showing PASH  HISTORY OF PRESENT ILLNESS:  History of Present Illness Wendy Waller is a 58 year old female who presents for follow-up on breast biopsy results and management of menopausal symptoms.  Her recent breast biopsy showed pseudoangiomatous stromal hyperplasia (PASH), a benign finding.  She has menopausal hot flashes and vaginal dryness. Veozah  has improved hot flashes by about 50%. Vaginal dryness is present but not severe. She is cautious about oral estrogen therapy because of her breast history and asked about using Amberin for menopause symptoms, expressing concern about its safety and effectiveness.     ALLERGIES:  is allergic to methotrexate, shellfish allergy, tadalafil, and sulfa antibiotics.  MEDICATIONS:  Current Outpatient Medications  Medication Sig Dispense Refill   Adalimumab (HUMIRA) 40 MG/0.4ML PSKT Inject into the skin every 14 (fourteen) days. Pt is stopping for > 14 days before  05/12/21 surgery per pt.     dicyclomine (BENTYL) 20 MG tablet Take 20 mg by mouth as needed.     Fezolinetant  (VEOZAH ) 45 MG TABS Take 1 tablet by mouth daily. 30 tablet 11   fluticasone (FLONASE) 50 MCG/ACT nasal spray Place into both nostrils daily as needed for allergies or rhinitis.     ibuprofen  (ADVIL ) 600 MG tablet Take 1 tablet (600 mg total) by mouth every 6 (six) hours as needed. 30 tablet 0   lisinopril  (ZESTRIL ) 10 MG tablet Take 10 mg by mouth at bedtime.     loratadine  (CLARITIN ) 10 MG tablet Take 10 mg by mouth daily as needed.     No current facility-administered medications for this visit.    PHYSICAL EXAMINATION: ECOG PERFORMANCE STATUS: 1 - Symptomatic but completely ambulatory  There were no  vitals filed for this visit. There were no vitals filed for this visit.  Physical Exam   (exam performed in the presence of a chaperone)  LABORATORY DATA:  I have reviewed the data as listed    Latest Ref Rng & Units 05/09/2021    2:39 PM 01/10/2021    7:49 AM 06/01/2014    2:19 PM  CMP  Glucose 70 - 99 mg/dL 92  91  87   BUN 6 - 20 mg/dL 15  16  85.2   Creatinine 0.44 - 1.00 mg/dL 9.68  9.39  0.8   Sodium 135 - 145 mmol/L 134  143  139   Potassium 3.5 - 5.1 mmol/L 4.1  4.1  4.0   Chloride 98 - 111 mmol/L 102  102    CO2 22 - 32 mmol/L 26   26   Calcium 8.9 - 10.3 mg/dL 8.6   8.8   Total Protein 6.4 - 8.3 g/dL   7.4   Total Bilirubin 0.20 - 1.20 mg/dL   9.73   Alkaline Phos 40 - 150 U/L   55   AST 5 - 34 U/L   17   ALT 0 - 55 U/L   15     Lab Results  Component Value Date   WBC 5.3 05/09/2021   HGB 11.8 (L) 05/09/2021   HCT 34.9 (L) 05/09/2021   MCV 95.4 05/09/2021   PLT 195 05/09/2021   NEUTROABS 2.9 06/01/2014    ASSESSMENT & PLAN:  Atypical lobular hyperplasia of  left breast History of ALH: Completed 5 years of tamoxifen  2024    Menopausal Symptoms Improved hot flashes on Veozah . Discussed recent warning about liver enzymes and confirmed recent liver function tests were normal. -Continue Veozah  and monitor liver function tests semi-annually.   Breast Cancer Surveillance: Breast exam 01/27/2024: Benign contrast enhanced mammograms (CEM) f 01/20/2024: Indeterminate focus of enhancement upper central left breast 0.6 cm biopsy: PASH -Order CEM annually  Vaginal dryness: Prescribed Estrace cream to be used twice a week   Follow-up in 1 year or sooner if any new concerns arise.  ------------------------------------- Assessment and Plan Assessment & Plan Atypical lobular hyperplasia of left breast No additional imaging required beyond regular surveillance. - Continue regular mammograms with contrast as scheduled.  Pseudoangiomatous stromal hyperplasia of  breast Diagnosed as PASH, a benign estrogen-driven condition. No need for removal or additional imaging. - Continue regular mammograms with contrast as scheduled.  Menopausal symptoms Symptoms improved by 50% with Veozah . Discussed safe use of vaginal estrogen cream despite breast cancer history. - Continue Veozah  for hot flashes. - Prescribed vaginal estrogen cream (Testrace) to be used up to twice a week with a toothpaste-sized amount.      No orders of the defined types were placed in this encounter.  The patient has a good understanding of the overall plan. she agrees with it. she will call with any problems that may develop before the next visit here.  I personally spent a total of 30 minutes in the care of the patient today including preparing to see the patient, getting/reviewing separately obtained history, performing a medically appropriate exam/evaluation, counseling and educating, placing orders, referring and communicating with other health care professionals, documenting clinical information in the EHR, independently interpreting results, communicating results, and coordinating care.   Wendy K [name], MD 01/27/24

## 2024-02-03 DIAGNOSIS — M25532 Pain in left wrist: Secondary | ICD-10-CM | POA: Diagnosis not present

## 2024-02-10 ENCOUNTER — Other Ambulatory Visit: Payer: Self-pay | Admitting: Gastroenterology

## 2024-02-10 DIAGNOSIS — K529 Noninfective gastroenteritis and colitis, unspecified: Secondary | ICD-10-CM

## 2024-02-10 DIAGNOSIS — K900 Celiac disease: Secondary | ICD-10-CM | POA: Diagnosis not present

## 2024-02-14 ENCOUNTER — Inpatient Hospital Stay: Admission: RE | Admit: 2024-02-14 | Discharge: 2024-02-14 | Attending: Gastroenterology

## 2024-02-14 DIAGNOSIS — K900 Celiac disease: Secondary | ICD-10-CM

## 2024-02-14 DIAGNOSIS — N281 Cyst of kidney, acquired: Secondary | ICD-10-CM | POA: Diagnosis not present

## 2024-02-14 DIAGNOSIS — K529 Noninfective gastroenteritis and colitis, unspecified: Secondary | ICD-10-CM

## 2024-02-14 MED ORDER — IOPAMIDOL (ISOVUE-300) INJECTION 61%
100.0000 mL | Freq: Once | INTRAVENOUS | Status: AC | PRN
  Administered 2024-02-14: 100 mL via INTRAVENOUS

## 2024-02-17 ENCOUNTER — Other Ambulatory Visit

## 2024-02-18 ENCOUNTER — Ambulatory Visit: Payer: BC Managed Care – PPO | Admitting: Hematology and Oncology

## 2024-02-18 ENCOUNTER — Other Ambulatory Visit

## 2025-01-26 ENCOUNTER — Inpatient Hospital Stay: Admitting: Hematology and Oncology
